# Patient Record
Sex: Male | Born: 1965 | Race: White | Hispanic: No | Marital: Single | Smoking: Never smoker
Health system: Southern US, Community
[De-identification: ages and names within clinical notes are randomized; demographics above are authoritative.]

## PROBLEM LIST (undated history)

## (undated) DIAGNOSIS — E785 Hyperlipidemia, unspecified: Secondary | ICD-10-CM

## (undated) DIAGNOSIS — H3500 Unspecified background retinopathy: Secondary | ICD-10-CM

## (undated) DIAGNOSIS — I10 Essential (primary) hypertension: Secondary | ICD-10-CM

## (undated) DIAGNOSIS — N529 Male erectile dysfunction, unspecified: Secondary | ICD-10-CM

## (undated) DIAGNOSIS — F419 Anxiety disorder, unspecified: Secondary | ICD-10-CM

## (undated) DIAGNOSIS — D124 Benign neoplasm of descending colon: Secondary | ICD-10-CM

## (undated) DIAGNOSIS — I639 Cerebral infarction, unspecified: Secondary | ICD-10-CM

## (undated) DIAGNOSIS — Z794 Long term (current) use of insulin: Secondary | ICD-10-CM

## (undated) DIAGNOSIS — E119 Type 2 diabetes mellitus without complications: Secondary | ICD-10-CM

## (undated) HISTORY — DX: Unspecified background retinopathy: H35.00

## (undated) HISTORY — DX: Cerebral infarction, unspecified: I63.9

## (undated) HISTORY — DX: Male erectile dysfunction, unspecified: N52.9

## (undated) HISTORY — DX: Long term (current) use of insulin: Z79.4

## (undated) HISTORY — DX: Type 2 diabetes mellitus without complications: E11.9

## (undated) HISTORY — PX: KNEE ARTHROSCOPY: SUR90

## (undated) HISTORY — DX: Essential (primary) hypertension: I10

## (undated) HISTORY — DX: Anxiety disorder, unspecified: F41.9

## (undated) HISTORY — DX: Hyperlipidemia, unspecified: E78.5

## (undated) HISTORY — DX: Benign neoplasm of descending colon: D12.4

---
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
## 1997-07-14 ENCOUNTER — Inpatient Hospital Stay (HOSPITAL_COMMUNITY): Admission: EM | Admit: 1997-07-14 | Discharge: 1997-07-15 | Payer: Self-pay | Admitting: Emergency Medicine

## 1997-07-20 ENCOUNTER — Emergency Department (HOSPITAL_COMMUNITY): Admission: EM | Admit: 1997-07-20 | Discharge: 1997-07-20 | Payer: Self-pay | Admitting: Emergency Medicine

## 1997-08-28 ENCOUNTER — Ambulatory Visit (HOSPITAL_COMMUNITY): Admission: RE | Admit: 1997-08-28 | Discharge: 1997-08-28 | Payer: Self-pay | Admitting: Internal Medicine

## 1997-09-11 ENCOUNTER — Encounter: Payer: Self-pay | Admitting: Neurology

## 1997-09-11 ENCOUNTER — Inpatient Hospital Stay (HOSPITAL_COMMUNITY): Admission: AD | Admit: 1997-09-11 | Discharge: 1997-09-14 | Payer: Self-pay | Admitting: Neurology

## 1997-09-12 ENCOUNTER — Encounter: Payer: Self-pay | Admitting: Neurology

## 2001-01-19 ENCOUNTER — Ambulatory Visit (HOSPITAL_COMMUNITY): Admission: RE | Admit: 2001-01-19 | Discharge: 2001-01-19 | Payer: Self-pay | Admitting: Family Medicine

## 2001-01-19 ENCOUNTER — Encounter: Payer: Self-pay | Admitting: Family Medicine

## 2002-01-14 ENCOUNTER — Inpatient Hospital Stay (HOSPITAL_COMMUNITY): Admission: EM | Admit: 2002-01-14 | Discharge: 2002-01-15 | Payer: Self-pay | Admitting: Emergency Medicine

## 2002-08-14 ENCOUNTER — Ambulatory Visit (HOSPITAL_COMMUNITY): Admission: RE | Admit: 2002-08-14 | Discharge: 2002-08-14 | Payer: Self-pay | Admitting: Family Medicine

## 2002-08-14 ENCOUNTER — Encounter: Payer: Self-pay | Admitting: Family Medicine

## 2002-08-28 ENCOUNTER — Encounter: Payer: Self-pay | Admitting: Family Medicine

## 2002-08-28 ENCOUNTER — Ambulatory Visit (HOSPITAL_COMMUNITY): Admission: RE | Admit: 2002-08-28 | Discharge: 2002-08-28 | Payer: Self-pay | Admitting: Family Medicine

## 2002-08-29 ENCOUNTER — Encounter: Admission: RE | Admit: 2002-08-29 | Discharge: 2002-11-27 | Payer: Self-pay | Admitting: Family Medicine

## 2002-12-23 ENCOUNTER — Ambulatory Visit (HOSPITAL_COMMUNITY): Admission: RE | Admit: 2002-12-23 | Discharge: 2002-12-23 | Payer: Self-pay | Admitting: Infectious Diseases

## 2003-12-09 ENCOUNTER — Emergency Department (HOSPITAL_COMMUNITY): Admission: EM | Admit: 2003-12-09 | Discharge: 2003-12-09 | Payer: Self-pay | Admitting: Emergency Medicine

## 2004-12-12 ENCOUNTER — Inpatient Hospital Stay (HOSPITAL_COMMUNITY): Admission: EM | Admit: 2004-12-12 | Discharge: 2004-12-14 | Payer: Self-pay | Admitting: *Deleted

## 2005-03-09 ENCOUNTER — Ambulatory Visit: Payer: Self-pay | Admitting: Family Medicine

## 2005-06-07 ENCOUNTER — Ambulatory Visit: Payer: Self-pay | Admitting: Family Medicine

## 2005-10-25 ENCOUNTER — Ambulatory Visit: Payer: Self-pay | Admitting: Family Medicine

## 2008-12-03 ENCOUNTER — Encounter: Payer: Self-pay | Admitting: Family Medicine

## 2009-10-13 ENCOUNTER — Ambulatory Visit: Payer: Self-pay | Admitting: Family Medicine

## 2009-10-13 DIAGNOSIS — E1065 Type 1 diabetes mellitus with hyperglycemia: Secondary | ICD-10-CM | POA: Insufficient documentation

## 2009-10-14 ENCOUNTER — Telehealth: Payer: Self-pay | Admitting: Family Medicine

## 2009-10-14 DIAGNOSIS — M899 Disorder of bone, unspecified: Secondary | ICD-10-CM | POA: Insufficient documentation

## 2009-10-14 DIAGNOSIS — M949 Disorder of cartilage, unspecified: Secondary | ICD-10-CM

## 2009-10-14 HISTORY — DX: Disorder of bone, unspecified: M89.9

## 2009-10-15 ENCOUNTER — Encounter: Payer: Self-pay | Admitting: Family Medicine

## 2009-10-15 LAB — CONVERTED CEMR LAB
AST: 24 units/L (ref 0–37)
Basophils Relative: 0 % (ref 0–1)
Eosinophils Absolute: 0.1 10*3/uL (ref 0.0–0.7)
Eosinophils Relative: 1 % (ref 0–5)
Helicobacter Pylori Antibody-IgG: <0.4
Lymphocytes Relative: 30 % (ref 12–46)
MCHC: 32.8 g/dL (ref 30.0–36.0)
Monocytes Absolute: 0.5 10*3/uL (ref 0.1–1.0)
Neutrophils Relative %: 61 % (ref 43–77)
Total Bilirubin: 0.9 mg/dL (ref 0.3–1.2)
WBC: 6.9 10*3/uL (ref 4.0–10.5)

## 2009-10-16 ENCOUNTER — Ambulatory Visit (HOSPITAL_COMMUNITY): Admission: RE | Admit: 2009-10-16 | Discharge: 2009-10-16 | Payer: Self-pay | Admitting: Family Medicine

## 2009-10-16 ENCOUNTER — Encounter: Payer: Self-pay | Admitting: Family Medicine

## 2009-10-16 LAB — CONVERTED CEMR LAB
BUN: 19 mg/dL (ref 6–23)
BUN: 7 mg/dL (ref 6–23)
CO2: 23 meq/L (ref 19–32)
CO2: 27 meq/L (ref 19–32)
Calcium: 10.3 mg/dL (ref 8.4–10.5)
Chloride: 94 meq/L — ABNORMAL LOW (ref 96–112)
Chloride: 103 meq/L (ref 96–112)
Creatinine, Ser: 0.91 mg/dL (ref 0.40–1.50)
PSA: 0.52 ng/mL (ref 0.10–4.00)
Potassium: >7.5 meq/L (ref 3.5–5.3)
TSH: 2.419 microintl units/mL (ref 0.350–4.500)

## 2009-10-19 ENCOUNTER — Telehealth: Payer: Self-pay | Admitting: Family Medicine

## 2009-10-19 ENCOUNTER — Encounter: Payer: Self-pay | Admitting: Family Medicine

## 2009-11-12 ENCOUNTER — Telehealth (INDEPENDENT_AMBULATORY_CARE_PROVIDER_SITE_OTHER): Payer: Self-pay | Admitting: *Deleted

## 2009-11-23 DIAGNOSIS — R110 Nausea: Secondary | ICD-10-CM | POA: Insufficient documentation

## 2009-11-24 ENCOUNTER — Encounter: Payer: Self-pay | Admitting: Family Medicine

## 2009-11-24 ENCOUNTER — Telehealth: Payer: Self-pay | Admitting: Family Medicine

## 2009-12-03 ENCOUNTER — Encounter (HOSPITAL_COMMUNITY): Admission: RE | Admit: 2009-12-03 | Payer: Self-pay | Admitting: Family Medicine

## 2009-12-08 ENCOUNTER — Ambulatory Visit: Payer: Self-pay | Admitting: Family Medicine

## 2009-12-08 LAB — HM DIABETES FOOT EXAM

## 2009-12-11 ENCOUNTER — Encounter: Payer: Self-pay | Admitting: Family Medicine

## 2010-01-03 HISTORY — PX: EYE SURGERY: SHX253

## 2010-01-24 ENCOUNTER — Encounter: Payer: Self-pay | Admitting: Family Medicine

## 2010-02-02 ENCOUNTER — Ambulatory Visit
Admission: RE | Admit: 2010-02-02 | Discharge: 2010-02-02 | Payer: Self-pay | Source: Home / Self Care | Attending: Family Medicine | Admitting: Family Medicine

## 2010-02-02 DIAGNOSIS — N529 Male erectile dysfunction, unspecified: Secondary | ICD-10-CM | POA: Insufficient documentation

## 2010-02-02 DIAGNOSIS — H547 Unspecified visual loss: Secondary | ICD-10-CM | POA: Insufficient documentation

## 2010-02-02 HISTORY — DX: Male erectile dysfunction, unspecified: N52.9

## 2010-02-04 ENCOUNTER — Encounter: Payer: Self-pay | Admitting: Family Medicine

## 2010-02-04 LAB — CONVERTED CEMR LAB: Microalb, Ur: 1.26 mg/dL (ref 0.00–1.89)

## 2010-02-04 NOTE — Progress Notes (Signed)
SummaryRushie Buchanan  Phone Note Call from Patient   Summary of Call: [name] AT [facility] Alanis AT [phone] ABOUT A RX Initial call taken by: Lind Guest,  October 19, 2009 1:04 PM  Follow-up for Phone Call        Phone Call Completed Follow-up by: Adella Hare LPN,  October 19, 2009 4:53 PM

## 2010-02-04 NOTE — Assessment & Plan Note (Signed)
Summary: office visit   Vital Signs:  Patient profile:   45 year old male Height:      71 inches Weight:      198 pounds BMI:     27.72 O2 Sat:      97 % on Room air Pulse rate:   87 / minute Pulse rhythm:   regular Resp:     16 per minute BP sitting:   120 / 60  (left arm)  Vitals Entered By: Adella Hare LPN (December 08, 2009 1:58 PM)  Nutrition Counseling: Patient's BMI is greater than 25 and therefore counseled on weight management options.  O2 Flow:  Room air CC: follow-up visit Is Patient Diabetic? Yes Did you bring your meter with you today? No Pain Assessment Patient in pain? no        CC:  follow-up visit.  History of Present Illness: Reports  that he is feeling much better, and isreassure by negative battery of tests recently done. Wants to hold off on HIDA AT THIS TIME. Denies recent fever or chills. Denies sinus pressure, nasal congestion , ear pain or sore throat. Denies chest congestion, or cough productive of sputum. Denies chest pain, palpitations, PND, orthopnea or leg swelling. Denies abdominal pain,, vomitting, diarrhea or constipation. Denies change in bowel movements or bloody stool. Denies dysuria , frequency, incontinence or hesitancy. Denies  joint pain, swelling, or reduced mobility. Denies headaches, vertigo, seizures. Denies depression, anxiety or insomnia. Denies  rash, lesions, or itch. BLOOD SUGARS STILL UNCONTROLLED, interested in nutrition referral, and will start testing with respect to meals and documenting     Current Medications (verified): 1)  Novolog Flexpen 100 Unit/ml Soln (Insulin Aspart) .... Per Sliding Scale 2)  Lantus Solostar 100 Unit/ml Soln (Insulin Glargine) .... 25 Units Twice Daily 3)  Zofran Orally Disineigrating Tablet 4mg  Tablet .... One Tablet Twice Daily As Needed For Severe Nausea  Allergies (verified): No Known Drug Allergies  Review of Systems      See HPI General:  Complains of fatigue. Eyes:   Denies discharge and red eye. GI:  Complains of nausea. Endo:  3 times daily 130 to 170, 2 hrs after the lunch avg170's to 190's, before supper rangeis 140's to 170'6. Heme:  Denies abnormal bruising, bleeding, enlarge lymph nodes, and fevers. Allergy:  Denies hives or rash and itching eyes.  Physical Exam  General:  Well-developed,well-nourished,in no acute distress; alert,appropriate and cooperative throughout examination HEENT: No facial asymmetry,  EOMI, No sinus tenderness, TM's Clear, oropharynx  pink and moist.   Chest: Clear to auscultation bilaterally.  CVS: S1, S2, No murmurs, No S3.   Abd: Soft, Nontender.  MS: Adequate ROM spine, hips, shoulders and knees.  Ext: No edema.   CNS: CN 2-12 intact, power tone and sensation normal throughout.   Skin: Intact, no visible lesions or rashes.  Psych: Good eye contact, normal affect.  Memory intact, not anxious or depressed appearing.   Diabetes Management Exam:    Foot Exam (with socks and/or shoes not present):       Sensory-Monofilament:          Left foot: diminished          Right foot: diminished       Inspection:          Left foot: normal          Right foot: normal       Nails:          Left  foot: thickened          Right foot: thickened   Impression & Recommendations:  Problem # 1:  NAUSEA (ICD-787.02) Assessment Improved  Problem # 2:  DIABETES MELLITUS, TYPE I, UNCONTROLLED (ICD-250.03) Assessment: Comment Only Patient advised to reduce carbs and sweets, commit to regular physical activity, take meds as prescribed, test blood sugars as directed, and attempt to lose weight , to improve blood sugar control.  His updated medication list for this problem includes:    Novolog Flexpen 100 Unit/ml Soln (Insulin aspart) .Marland Kitchen... Per sliding scale    Lantus Solostar 100 Unit/ml Soln (Insulin glargine) .Marland Kitchen... 25 units twice daily  Orders: T- Hemoglobin A1C (16109-60454)  Labs Reviewed: Creat: 0.77 (10/16/2009)      Reviewed HgBA1c results: 8.5 (10/13/2009)  Complete Medication List: 1)  Novolog Flexpen 100 Unit/ml Soln (Insulin aspart) .... Per sliding scale 2)  Lantus Solostar 100 Unit/ml Soln (Insulin glargine) .... 25 units twice daily 3)  Zofran Orally Disineigrating Tablet 4mg  Tablet  .... One tablet twice daily as needed for severe nausea  Other Orders: Pneumococcal Vaccine (09811) Admin 1st Vaccine (91478) Tdap => 37yrs IM (29562) Admin of Any Addtl Vaccine (13086)  Patient Instructions: 1)  F/U end January. 2)  HBA1c  end January. 3)  You will be referred to diabetic ed. 4)  PLS start recording your tests 3 times daily. 5)  Pneumonia and TDAP today   Orders Added: 1)  Est. Patient Level IV [57846] 2)  T- Hemoglobin A1C [83036-23375] 3)  Pneumococcal Vaccine [90732] 4)  Admin 1st Vaccine [90471] 5)  Tdap => 40yrs IM [90715] 6)  Admin of Any Addtl Vaccine [96295]   Immunizations Administered:  Pneumonia Vaccine:    Vaccine Type: Pneumovax    Site: right deltoid    Mfr: Merck    Dose: 0.5 ml    Route: IM    Given by: Adella Hare LPN    Exp. Date: 03/22/2011    Lot #: [id number]    VIS given: 12/08/08 version given December 08, 2009.  Tetanus Vaccine:    Vaccine Type: Tdap    Site: left deltoid    Mfr: GlaxoSmithKline    Dose: 0.5 ml    Route: IM    Given by: Adella Hare LPN    Exp. Date: 10/23/2011    Lot #: [id number]    VIS given: 11/21/07 version given December 08, 2009.   Immunizations Administered:  Pneumonia Vaccine:    Vaccine Type: Pneumovax    Site: right deltoid    Mfr: Merck    Dose: 0.5 ml    Route: IM    Given by: Adella Hare LPN    Exp. Date: 03/22/2011    Lot #: [id number]    VIS given: 12/08/08 version given December 08, 2009.  Tetanus Vaccine:    Vaccine Type: Tdap    Site: left deltoid    Mfr: GlaxoSmithKline    Dose: 0.5 ml    Route: IM    Given by: Adella Hare LPN    Exp. Date: 10/23/2011    Lot #: [id number]    VIS given: 11/21/07  version given December 08, 2009.

## 2010-02-04 NOTE — Progress Notes (Signed)
Summary: results  Phone Note Call from Patient   Summary of Call: pt would like results. [phone] Initial call taken by: Rudene Anda,  October 14, 2009 11:11 AM  Follow-up for Phone Call        see append Follow-up by: Syliva Overman MD,  October 15, 2009 5:15 AM  Additional Follow-up for Phone Call Additional follow up Details #1::        Patient aware Additional Follow-up by: Everitt Amber LPN,  October 15, 2009 12:27 PM  New Problems: SPECIAL SCREENING MALIGNANT NEOPLASM OF PROSTATE (ICD-V76.44) FATIGUE (ICD-780.79) DISORDER OF BONE AND CARTILAGE UNSPECIFIED (ICD-733.90)   New Problems: SPECIAL SCREENING MALIGNANT NEOPLASM OF PROSTATE (ICD-V76.44) FATIGUE (ICD-780.79) DISORDER OF BONE AND CARTILAGE UNSPECIFIED (ICD-733.90)

## 2010-02-04 NOTE — Progress Notes (Signed)
Summary: medicine  Phone Note Call from Patient   Summary of Call: need the decsengrading  zofram  please send to [facility]  call back at   [phone] Initial call taken by: Lind Guest,  November 24, 2009 2:22 PM  Follow-up for Phone Call        completed Follow-up by: Syliva Overman MD,  November 24, 2009 2:53 PM

## 2010-02-04 NOTE — Medication Information (Signed)
Summary: Tax adviser   Imported By: Lind Guest 11/24/2009 17:29:13  _____________________________________________________________________  External Attachment:    Type:   Image     Comment:   External Document

## 2010-02-04 NOTE — Letter (Signed)
Summary: MEDICAL RELEASE  MEDICAL RELEASE   Imported By: Lind Guest 10/19/2009 13:01:31  _____________________________________________________________________  External Attachment:    Type:   Image     Comment:   External Document

## 2010-02-04 NOTE — Progress Notes (Signed)
Summary: MEDICINE  Phone Note Call from Patient   Summary of Call: [facility] APPT FOR 12.6.11 NEEDS ZOFRAM NOT TABLET NEEDS THE KIND YOU PUT UNDER YOUR TONGUE SEND TO [facility] [place] CALL BACK AT [phone] Initial call taken by: Lind Guest,  November 12, 2009 1:38 PM  Follow-up for Phone Call        pls let pt know , since he has so much nausea, and his gall bladder US shows no stones , he needs to have a hIDA scan to see the function , if he agrees pls sched, Follow-up by: Syliva Overman MD,  November 23, 2009 8:11 PM  Additional Follow-up for Phone Call Additional follow up Details #1::        prescription for nausea printwed, pls let him know and fax, thanks Additional Follow-up by: Syliva Overman MD,  November 24, 2009 2:53 PM  New Problems: NAUSEA (ICD-787.02)   Additional Follow-up for Phone Call Additional follow up Details #2::    FAXED IN PRESCRIPITON AND CALLED PATIENT TO ADVISE IT WAS SENT IN. Follow-up by: Curtis Sites,  November 24, 2009 4:36 PM  New Problems: NAUSEA (ICD-787.02) New/Updated Medications: * ZOFRAN ORALLY DISINEIGRATING TABLET 4MG  TABLET one tablet twice daily as needed for severe nausea Prescriptions: ZOFRAN ORALLY DISINEIGRATING TABLET 4MG  TABLET one tablet twice daily as needed for severe nausea  #20 x 0   Entered and Authorized by:   Syliva Overman MD   Signed by:   Syliva Overman MD on 11/24/2009   Method used:   Printed then faxed to ...       [facility] [address] [phone]* (retail)       [address], [place]  [address]       Ph: [phone]       Fax: [phone]   RxID:   [id number]

## 2010-02-04 NOTE — Assessment & Plan Note (Signed)
Summary: office visit   Vital Signs:  Patient profile:   46 year old male Height:      71 inches Weight:      196.25 pounds BMI:     27.47 O2 Sat:      97 % on Room air Pulse rate:   91 / minute Pulse rhythm:   regular Resp:     16 per minute BP sitting:   126 / 84  (left arm)  Vitals Entered By: Adella Hare LPN (October 13, 2009 11:58 AM)  Nutrition Counseling: Patient's BMI is greater than 25 and therefore counseled on weight management options.  O2 Flow:  Room air CC: nausea, vommitting, decreased appetite, weight loss, low grade fever in evening over one week Is Patient Diabetic? Yes Pain Assessment Patient in pain? no        CC:  nausea, vommitting, decreased appetite, weight loss, and low grade fever in evening over one week.  History of Present Illness: Fatigue for several months, low grade fevers to 102 for 1 month lump on neck right side.Increasing in size, painless intermittent nausea. pt re-establishing care afterover 3 yrs, stats he has not been doing well in recent times and wants to kniow what's going on. He denies depression, generally has no anxiety. He denies head or chest congestion. He denies abdominal pain, he has niotednausea, he denies diarreah or constipation. He has chronic eD, but denies frequency or poor urinary stream. pt states he was in an ED 1 day ago, where he left his job because he felt so ill, no abn was identified per his report. he has not been diligent with blood sugar testing and reports fluctuation in values when he does test. He has not had his HBA1C checked for over 4 mths.    Preventive Screening-Counseling & Management  Alcohol-Tobacco     Smoking Status: never  Caffeine-Diet-Exercise     Does Patient Exercise: no      Drug Use:  no.    Allergies (verified): No Known Drug Allergies  Past History:  Past Medical History: IDDM  sinc e age 34ge 18 ED  Past Surgical History: none  Family History: Mom age 59, DM,  HTN and hyperlipidemia and rosacea 2011 Dad  age 67 HTN and gERd and nicotine Sisters x 2 , one has IDDM  Social History: Single. Employed  as EMT Never Smoked Alcohol use-no Drug use-no Regular exercise-no Smoking Status:  never Drug Use:  no Does Patient Exercise:  no  Review of Systems      See HPI General:  Complains of chills, fatigue, fever, loss of appetite, malaise, and weakness. Eyes:  Denies discharge, eye pain, and red eye. ENT:  Denies earache, hoarseness, nasal congestion, and sinus pressure. CV:  Denies chest pain or discomfort, palpitations, shortness of breath with exertion, and swelling of feet. Resp:  Denies cough, sputum productive, and wheezing. GI:  Complains of loss of appetite and nausea; denies abdominal pain, bloody stools, constipation, diarrhea, and vomiting. GU:  Complains of erectile dysfunction; denies hematuria, incontinence, nocturia, urinary frequency, and urinary hesitancy. MS:  Denies joint pain and stiffness. Derm:  Complains of lesion(s); denies itching and rash; concerned about painless right neck swelling. Neuro:  Denies headaches, poor balance, seizures, and sensation of room spinning. Psych:  Complains of anxiety; denies depression and mental problems. Endo:  Denies excessive thirst and excessive urination. Heme:  Denies abnormal bruising and bleeding. Allergy:  Denies hives or rash and itching eyes.  Physical Exam  General:  Well-developed,well-nourished,in no acute distress; alert,appropriate and cooperative throughout examinationAnxious HEENT: No facial asymmetry,  EOMI, No sinus tenderness, TM's Clear, oropharynx  pink and moist. Right anterior cervical swelling  Chest: Clear to auscultation bilaterally.  CVS: S1, S2, No murmurs, No S3.   Abd: Soft, Nontender.  MS: Adequate ROM spine, hips, shoulders and knees.  Ext: No edema.   CNS: CN 2-12 intact, power tone and sensation normal throughout.   Skin: Intact, no visible lesions or  rashes.  Psych: Good eye contact, normal affect.  Memory intact, not  depressed appearing.   Diabetes Management Exam:    Foot Exam (with socks and/or shoes not present):       Sensory-Monofilament:          Left foot: diminished          Right foot: diminished       Inspection:          Left foot: normal          Right foot: normal       Nails:          Left foot: normal          Right foot: normal   Impression & Recommendations:  Problem # 1:  NECK MASS (ICD-784.2) Assessment Comment Only  Orders: Radiology Referral (Radiology)  Problem # 2:  DIABETES MELLITUS, TYPE I, UNCONTROLLED (ICD-250.03) Assessment: Comment Only  The following medications were removed from the medication list:    Lantus Solostar 100 Unit/ml Soln (Insulin glargine) .Marland Kitchen... 20 units in am and 20 units pm His updated medication list for this problem includes:    Novolog Flexpen 100 Unit/ml Soln (Insulin aspart) .Marland Kitchen... Per sliding scale    Lantus Solostar 100 Unit/ml Soln (Insulin glargine) .Marland Kitchen... 25 units twice daily  Orders: T- Hemoglobin A1C ([address]31-51761)  Complete Medication List: 1)  Novolog Flexpen 100 Unit/ml Soln (Insulin aspart) .... Per sliding scale 2)  Lantus Solostar 100 Unit/ml Soln (Insulin glargine) .... 25 units twice daily  Other Orders: T-Hepatic Function [phone]) T-CBC w/Diff [phone]) T-Syphilis Test (RPR) [phone]) T-HIV Antibody  (Reflex) (93716-96789) TLB-H. Pylori Abs(Helicobacter Pylori) ([address]HELICO)  Patient Instructions: 1)  F/U in 5 weeks 2)  Labs today, we will get the others from the Ed 3)  Hepatic Panel prior to visit, ICD-9: 4)  HbgA1C prior to visit, ICD-9: 5)  RPR and HIV 6)  CBC 7)  You will ber referred for gllbladder studies, andUS of your neck and probably to a surgeon Prescriptions: LANTUS SOLOSTAR 100 UNIT/ML SOLN (INSULIN GLARGINE) 25 units twice daily  #1500 units x 3   Entered and Authorized by:   Syliva Overman MD   Signed by:    Syliva Overman MD on 10/18/2009   Method used:   Printed then faxed to ...       [facility] [address] [phone]* (retail)       [address] [place]  [address]       Ph: [phone]       Fax: [phone]   RxID:   [id number]

## 2010-02-04 NOTE — Letter (Signed)
Summary: nutritional care  nutritional care   Imported By: Lind Guest 12/11/2009 15:16:04  _____________________________________________________________________  External Attachment:    Type:   Image     Comment:   External Document

## 2010-02-06 ENCOUNTER — Encounter: Payer: Self-pay | Admitting: Family Medicine

## 2010-02-06 LAB — CONVERTED CEMR LAB
ALT: 18 units/L (ref 0–53)
AST: 16 units/L (ref 0–37)
Albumin: 4.4 g/dL (ref 3.5–5.2)
Creatinine, Ser: 0.73 mg/dL (ref 0.40–1.50)
Potassium: 4.3 meq/L (ref 3.5–5.3)
Sodium: 140 meq/L (ref 135–145)

## 2010-02-10 NOTE — Assessment & Plan Note (Signed)
Summary: F UP   Vital Signs:  Patient profile:   45 year old male Height:      71 inches Weight:      204.25 pounds BMI:     28.59 O2 Sat:      98 % on Room air Pulse rate:   94 / minute Pulse rhythm:   regular Resp:     16 per minute BP sitting:   140 / 78  (left arm)  Vitals Entered By: Adella Hare LPN (February 02, 2010 11:11 AM)  Nutrition Counseling: Patient's BMI is greater than 25 and therefore counseled on weight management options.  O2 Flow:  Room air CC: follow-up visit Is Patient Diabetic? Yes Comments did not bring meds to ov but states list is accurate   CC:  follow-up visit.  History of Present Illness: Reports  that the is doing well. Denies recent fever or chills. Denies sinus pressure, nasal congestion , ear pain or sore throat. Denies chest congestion, or cough productive of sputum. Denies chest pain, palpitations, PND, orthopnea or leg swelling. Denies abdominal pain, nausea, vomitting, diarrhea or constipation. Denies change in bowel movements or bloody stool. Denies dysuria , frequency, incontinence or hesitancy. Denies  joint pain, swelling, or reduced mobility. Denies headaches, vertigo, seizures. Denies depression, anxiety or insomnia. Denies  rash, lesions, or itch. He has blood sugar diary is testing twice daily and has noted marked improvement in his sugars as well as how he feels     Allergies: No Known Drug Allergies  Review of Systems      See HPI Eyes:  Complains of vision loss-both eyes; denies discharge and eye pain. Endo:  Denies excessive hunger and excessive thirst. Heme:  Denies abnormal bruising and bleeding. Allergy:  Denies hives or rash.  Physical Exam  General:  Well-developed,well-nourished,in no acute distress; alert,appropriate and cooperative throughout examination HEENT: No facial asymmetry,  EOMI, No sinus tenderness, TM's Clear, oropharynx  pink and moist.   Chest: Clear to auscultation bilaterally.  CVS: S1,  S2, No murmurs, No S3.   Abd: Soft, Nontender.  MS: Adequate ROM spine, hips, shoulders and knees.  Ext: No edema.   CNS: CN 2-12 intact, power tone and sensation normal throughout.   Skin: Intact, no visible lesions or rashes.  Psych: Good eye contact, normal affect.  Memory intact, not anxious or depressed appearing.    Impression & Recommendations:  Problem # 1:  ERECTILE DYSFUNCTION, ORGANIC (ICD-607.84) Assessment Unchanged  His updated medication list for this problem includes:    Levitra 20 Mg Tabs (Vardenafil hcl) ..... One tablet 30 minutes before intercourse as needed  Problem # 2:  DIABETES MELLITUS, TYPE I, UNCONTROLLED (ICD-250.03) Assessment: Improved  His updated medication list for this problem includes:    Novolog Flexpen 100 Unit/ml Soln (Insulin aspart) .Marland Kitchen... Per sliding scale    Lantus Solostar 100 Unit/ml Soln (Insulin glargine) .Marland Kitchen... 25 units twice daily  Orders: T-CMP with estimated GFR ([address]) T- Hemoglobin A1C (40981-19147) T-Urine Microalbumin w/creat. ratio [id number]) T- Hemoglobin A1C [phone])  Labs Reviewed: Creat: 0.77 (10/16/2009)    Reviewed HgBA1c results: 8.5 (10/13/2009)  Complete Medication List: 1)  Novolog Flexpen 100 Unit/ml Soln (Insulin aspart) .... Per sliding scale 2)  Lantus Solostar 100 Unit/ml Soln (Insulin glargine) .... 25 units twice daily 3)  Zofran Orally Disineigrating Tablet 4mg  Tablet  .... One tablet twice daily as needed for severe nausea 4)  Levitra 20 Mg Tabs (Vardenafil hcl) .... One tablet 30 minutes before  intercourse as needed 5)  Onetouch Ultra Blue Strp (Glucose blood) .... Three times a day testing dx:250.01 6)  Onetouch Delica Lancets Misc (Lancets) .... Three times a day testing dx:250.01  Other Orders: T-Lipid Profile (04540-98119) Ophthalmology Referral (Ophthalmology)  Patient Instructions: 1)  Please schedule a follow-up appointment in 3 months. 2)  It is important that you  exercise regularly at least 30 minutes 5 times a week. If you develop chest pain, have severe difficulty breathing, or feel very tired , stop exercising immediately and seek medical attention. 3)  You need to lose weight. Consider a lower calorie diet and regular exercise.  4)  BMP prior to visit, ICD-9: and egfr 5)  HbgA1C prior to visit, ICD-9:  today 6)  Urine Microalbumin prior to visit, ICD-9: 7)  Fasting lipid hBA1C in 3 months 8)  pls continue to test and record blood sugars, they will only improve Prescriptions: ONETOUCH DELICA LANCETS  MISC (LANCETS) three times a day testing dx:250.01  #100 x 3   Entered by:   Adella Hare LPN   Authorized by:   Syliva Overman MD   Signed by:   Adella Hare LPN on 14/78/2956   Method used:   Electronically to        [facility] [address] [phone]* (retail)       [address] [place], [place]  [address]       Ph: [phone]       Fax: [phone]   RxID:   [id number] [place] BLUE  STRP (GLUCOSE BLOOD) three times a day testing dx:250.01  #100 x 4   Entered by:   Adella Hare LPN   Authorized by:   Syliva Overman MD   Signed by:   Adella Hare LPN on 38/75/6433   Method used:   Electronically to        [facility] [phone]* (retail)       [address] [place], [place]  [address]       Ph: [phone]       Fax: [phone]   RxID:   [id number] LEVITRA 20 MG TABS (VARDENAFIL HCL) one tablet 30 minutes before intercourse as needed  #8 x 3   Entered and Authorized by:   Syliva Overman MD   Signed by:   Syliva Overman MD on 02/02/2010   Method used:   Electronically to        [facility] [address] [phone]* (retail)       [address] [place], [place]  [address]       Ph: [phone]       Fax: [phone]   RxID:   [id number]    Orders Added: 1)  Est. Patient Level IV [10175] 2)  T-CMP with estimated GFR [[address]] 3)  T- Hemoglobin A1C [83036-23375] 4)  T-Urine Microalbumin  w/creat. ratio [82043-[address]] 5)  T-Lipid Profile [80061-22930] 6)  T- Hemoglobin A1C [83036-23375] 7)  Ophthalmology Referral [Ophthalmology]

## 2010-02-10 NOTE — Miscellaneous (Signed)
  Clinical Lists Changes  Medications: Removed medication of LANTUS SOLOSTAR 100 UNIT/ML SOLN (INSULIN GLARGINE) 25 units twice daily Added new medication of LANTUS SOLOSTAR 100 UNIT/ML SOLN (INSULIN GLARGINE) 30 units twice daily, dose increase effective 02/06/2010 - Signed Rx of LANTUS SOLOSTAR 100 UNIT/ML SOLN (INSULIN GLARGINE) 30 units twice daily, dose increase effective 02/06/2010;  #1800 units x 4;  Signed;  Entered by: Syliva Overman MD;  Authorized by: Syliva Overman MD;  Method used: Historical    Prescriptions: LANTUS SOLOSTAR 100 UNIT/ML SOLN (INSULIN GLARGINE) 30 units twice daily, dose increase effective 02/06/2010  #1800 units x 4   Entered and Authorized by:   Syliva Overman MD   Signed by:   Syliva Overman MD on 02/06/2010   Method used:   Historical   RxID:   [id number]

## 2010-03-28 ENCOUNTER — Other Ambulatory Visit: Payer: Self-pay | Admitting: Family Medicine

## 2010-04-27 ENCOUNTER — Encounter: Payer: Self-pay | Admitting: Family Medicine

## 2010-04-29 ENCOUNTER — Encounter: Payer: Self-pay | Admitting: Family Medicine

## 2010-05-03 ENCOUNTER — Ambulatory Visit: Payer: Self-pay | Admitting: Family Medicine

## 2010-05-21 NOTE — H&P (Signed)
NAME:  Sean Buchanan, Sean Buchanan                   ACCOUNT NO.:  [id number]   MEDICAL RECORD NO.:  [id number]          PATIENT TYPE:  INP   LOCATION:  A217                          FACILITY:  [facility]   PHYSICIAN:  Margaretmary Dys, M.D.DATE OF BIRTH:  19-Oct-1965   DATE OF ADMISSION:  12/12/2004  DATE OF DISCHARGE:  LH                                HISTORY & PHYSICAL   PRIMARY CARE PHYSICIAN:  Milus Mallick. Lodema Hong, M.D.   ADMITTING DIAGNOSES:  1.  Diabetic ketoacidosis.  2.  Severe dehydration.  3.  Nausea and vomiting.   CHIEF COMPLAINT:  Nausea, vomiting, and weakness of two days' duration.   Sean Buchanan is a 45 year old patient with a 20-year history of type 1 diabetes  who presented to the emergency room with complaints of nausea, vomiting, and  generalized body weakness of two days' duration.  The patient said he was  fine on Friday night an actually went out to a party and has some mushrooms  and other meals.  He did fine with no abdominal cramping.  However, on  Saturday morning, he felt fairly sick with nausea and vomiting.  He denies  any significant abdominal pain, no diarrhea or constipation.  He has no  frequency, urgency, or dysuria, no hematuria.  He has no cough, no shortness  of breath, or any flulike symptoms.  He has had no headache, dizziness, or  lightheadedness.  The patient tried to drink some fluids yesterday, but  today the vomiting became intractable, and his blood sugars became elevated,  and he decided to come into the emergency room.  Evaluation her revealed  that he was in diabetic ketoacidosis with blood sugars close to 400, and his  pH was 7.2 with positive acetone in his blood.  The patient has been  admitted now for further evaluation and management.  He has received one  liter of saline in the emergency room and was started on an insulin infusion  after 10 units of Regular Insulin bolus.  The patient feels slightly better.   REVIEW OF SYSTEMS:  Temporary review of  systems as mentioned in history of  present illness above.   PAST MEDICAL HISTORY:  Type 1 diabetes of 20 years' duration.  The patient  states he has no complications including retinopathy or neuropathy.   MEDICATIONS:  1.  Lantus 15 units subcutaneously b.i.d.  2.  Sliding scale insulin,  FSBG q.a.c., q.h.s.   ALLERGIES:  He has no known drug allergies.   FAMILY HISTORY:  Positive for diabetes in mother and also a sister.  History  of coronary artery disease with MI.  An aunt died of a massive MI.  Also a  positive family history of ovarian and breast cancer and brain tumors.   SOCIAL HISTORY:  The patient is single.  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   He is a lifelong nonsmoker, does not drink alcohol or use IV drug.   PHYSICAL EXAMINATION:  GENERAL:  The patient was conscious, lethargic, and  appeared quite weak.  He was not in respiratory  distress.  VITAL SIGNS:  Blood pressure on arrival 150/89, pulse 114, temperature 97  degrees Fahrenheit, respiratory rate 20, oxygen saturation 98% on room air.  Pain scale was 0/10.  HEENT EXAM:  Normocephalic, atraumatic.  Oral mucosa was very dry.  No  exudates were noted.  NECK:  Supple, no JVD, no lymphadenopathy.  LUNGS:  Clear clinically with good air entry bilaterally.  HEART:  S1 and  S2 regular, no S3, S4, gallops, or rubs.  ABDOMEN:  Soft, nontender.  Bowel sounds were positive.  No masses palpable.  EXTREMITIES:  No pitting pedal edema.  No calf induration or tenderness was  noted.  CNS EXAMINATION:  The patient was conscious, lethargic.  No focal neurologic  deficit was noted.   LABORATORY DATA:  Blood gas on room air showed a pH of 7.256, PCO2 24.6, PO2  112, bicarbonate 10.6, with a base deficit of 15.2, oxygen saturation 97.8%.   WBC was 16.6, hemoglobin 16.8, hematocrit 49.8, platelet count 346,000,  neutrophils 79%.  Sodium 134, potassium 4.3, chloride 102, CO2 14, glucose  396, BUN 18, creatinine 1.4.   Total bilirubin was 1.9, alkaline phosphatase  158, AST 27, ALT 17, total protein 7.7, calcium 9.1.  Blood acetone was  positive.  Lipase was 12.  Urinalysis was positive for glucose, blood,  protein, and acetones.   Urine microscopy was negative.   ASSESSMENT AND PLAN:  Sean Buchanan is a 45 year old Caucasian male with a 20-  year history of type 1 diabetes who presents to the emergency room with  nausea, vomiting, and weakness.  Evaluation revealed that he is in diabetic  ketoacidosis.  It is possible that the patient may have had a food  poisoning, possible viral gastritis.  The patient says his blood sugars have  been fairly well controlled in the lower 200s and 100s range.  The plan is  to admit him at this time to the intensive care unit.  We will hydrate him  with saline and continue on the insulin infusion with a sliding scale.  We  will keep n.p.o. for now and will be able to advance his diet as tolerated.  DVT prophylaxis will be Lovenox.  GI prophylaxis will be with Protonix.   I have discussed the above plan with the patient, and he verbalized full  understanding.      Margaretmary Dys, M.D.  Electronically Signed     AM/MEDQ  D:  12/12/2004  T:  12/12/2004  Job:  [id number]   cc:   Milus Mallick. Lodema Hong, M.D.  Fax: [phone]

## 2010-05-21 NOTE — Discharge Summary (Signed)
NAME:  Sean Buchanan, Sean Buchanan                   ACCOUNT NO.:  [id number]   MEDICAL RECORD NO.:  [id number]          PATIENT TYPE:  INP   LOCATION:  A217                          FACILITY:  [facility]   PHYSICIAN:  Osvaldo Shipper, MD     DATE OF BIRTH:  08/24/1965   DATE OF ADMISSION:  12/12/2004  DATE OF DISCHARGE:  12/12/2006LH                                 DISCHARGE SUMMARY   DISCHARGE DIAGNOSES:  1.  Diabetic ketoacidosis, resolved.  2.  Type 1 diabetes.  3.  Mild proteinuria.   HISTORY OF PRESENT ILLNESS:  Please see H&P dictated at time of admission  for details regarding the patient's presenting illness.   BRIEF HOSPITAL COURSE:  Briefly, this is a 45 year old white male with a 20-  year history of type 1 diabetes who presented to the emergency room with  nausea, vomiting and generalized body weakness.  He was found to be in  diabetic ketoacidosis.  The patient was admitted to the hospital and managed  for DKA.  The patient has done well.  He has been able to tolerate p.o. with  no difficulties and does not have any nausea or vomiting.  The patient  mentioned that he has been compliant with his medications.  It is possible  that the precipitant factor was a viral syndrome.  We have adjusted his  Lantus insulin while he has been in this hospital.  His A1c level was  checked, which was 10.1, implying poor control in the past few months.  He  did mention that he has not been seeing a physician for the past many  months.  He said his last A1c was checked almost 2 years ago and was about  7.0.   The patient also mentioned that he has moved to [facility] and currently he  was visiting his mother when all of these symptoms happened.  He will pursue  a primary medical doctor at [facility].   DISCHARGE MEDICATIONS:  1.  Lantus insulin 16 units subcu twice daily.  2.  NovoLog on a sliding scale as before.   OTHER INSTRUCTIONS:  The patient has been told that he has mild proteinuria  and would benefit  from an ACE inhibitor; however, he needs to pursue this  with a primary medical doctor.  I did not want to start him on an ACE  inhibitor without knowing that the patient will follow up with a physician.   DIET:  An 1800-calorie ADA diet.   PHYSICAL ACTIVITY:  No restrictions.   CONSULTATIONS:  No consultations obtained.   IMAGING STUDIES:  None.   COMMENT:  Please note that the above is preliminary until signed.      Osvaldo Shipper, MD  Electronically Signed     GK/MEDQ  D:  12/14/2004  T:  12/14/2004  Job:  [id number]   cc:   Milus Mallick. Lodema Hong, M.D.  Fax: [phone]

## 2010-05-21 NOTE — Discharge Summary (Signed)
NAME:  Yeats, Bandy                               ACCOUNT NO.:  [id number]   MEDICAL RECORD NO.:  [id number]                   PATIENT TYPE:  INP   LOCATION:  0357                                 FACILITY:  [facility]   PHYSICIAN:  Sherin Quarry, MD                   DATE OF BIRTH:  [birth year]   DATE OF ADMISSION:  01/14/2002  DATE OF DISCHARGE:  01/15/2002                                 DISCHARGE SUMMARY   HISTORY OF PRESENT ILLNESS:  The patient is a 45 year old gentleman with  type 1 diabetes who presented on January 12th p.m. with a 12-hour history of  nausea and vomiting associated with malaise.  He had previously been managed  by a nurse practitioner in [place] who had been advising him about  management of his insulin pump.  He had last been seen at that facility  about two months previously according to the patient.  His diabetes  management generally consists of the use of an insulin pump at 1.6 units per  hour with boluses before meals which he generally calculates on the basis of  the number of grams of carbohydrates he will consume.  It would appear from  his description that his diabetes regulation is not optimal.  He often will  record blood sugars greater than 200.   PHYSICAL EXAMINATION:  VITAL SIGNS:  On presentation to the emergency room  at [facility], he was seen by Dr. Soyla Dryer.  His blood pressure was  129/89, pulse was 116, respirations 24, O2 saturation was 98%.  GENERAL:  The patient was alert and oriented.  HEENT:  Within normal limits.  CHEST:  Clear.  CARDIOVASCULAR:  A sinus tachycardia.  ABDOMEN:  Benign.  There were normal bowel sounds without masses,  tenderness, or organomegaly.  NEUROLOGIC AND EXTREMITIES:  Testing was normal.   LABORATORY DATA:  The initial laboratory studies were remarkable for a  sodium of 139, potassium 4.7, chloride 110, CO2 16, creatinine 1.1, BUN 26,  glucose 309.  The patient had an arterial blood gas which showed an  O2 of  95, PCO2 of 25, pH of 7.25.  The patient was felt to have evidence of mild  to moderate diabetic ketoacidosis.   HOSPITAL COURSE:  He was given a vigorous IV hydration with normal saline.  One liter was run in then he was given 500 cc per hour.  He was placed on  insulin effusion of 3 units per hour.  He proved to be relatively sensitive  to insulin infusion and therefore the Glucommander was never actually begun  although the Glucommander protocol was requested.  By January 13th a.m. his  CO2 was up to 24, renal function was normal, potassium was 3.9, blood sugar  was 210.  The patient was no longer experiencing nausea and vomiting and had  a good appetite.  Therefore at  that point, I chose to advance his diet to a  2000 calorie ADA diet, discontinue IV insulin and start him to resume his  basal insulin infusion.  I suggested he increase this from 1.6 to 2 units  per hour.  He will continue his current management in regard to insulin  boluses.  I advised him on January 13th that he could be discharged but told  him that I felt it was essential that he have  assistance in his diabetes  management from an endocrine specialist and suggested that he see Dr. Talmage Nap  at the [facility].  He expressed interest in doing so.  An  appointment was made with her on January 21st at 10:15 a.m.   DISCHARGE DIAGNOSES:  1. Diabetic ketoacidosis.  2.     Vomiting and dehydration.  3. Possible gastroenteritis.   CONDITION ON DISCHARGE:  Good.                                               Sherin Quarry, MD    SY/MEDQ  D:  01/15/2002  T:  01/15/2002  Job:  [id number]   cc:   Dr. Phineas Real in [place], M.D.  [phone] [address] [place] [address]  Fax: [phone]

## 2010-07-30 ENCOUNTER — Other Ambulatory Visit (INDEPENDENT_AMBULATORY_CARE_PROVIDER_SITE_OTHER): Payer: PRIVATE HEALTH INSURANCE | Admitting: Ophthalmology

## 2010-07-30 DIAGNOSIS — H4310 Vitreous hemorrhage, unspecified eye: Secondary | ICD-10-CM

## 2010-07-30 DIAGNOSIS — E11359 Type 2 diabetes mellitus with proliferative diabetic retinopathy without macular edema: Secondary | ICD-10-CM

## 2010-07-30 DIAGNOSIS — H43819 Vitreous degeneration, unspecified eye: Secondary | ICD-10-CM

## 2010-08-03 ENCOUNTER — Ambulatory Visit (INDEPENDENT_AMBULATORY_CARE_PROVIDER_SITE_OTHER): Payer: PRIVATE HEALTH INSURANCE | Admitting: Family Medicine

## 2010-08-03 ENCOUNTER — Encounter: Payer: Self-pay | Admitting: Family Medicine

## 2010-08-03 VITALS — BP 120/82 | HR 83 | Resp 16 | Ht 73.0 in | Wt 206.0 lb

## 2010-08-03 DIAGNOSIS — E1065 Type 1 diabetes mellitus with hyperglycemia: Secondary | ICD-10-CM

## 2010-08-03 DIAGNOSIS — N529 Male erectile dysfunction, unspecified: Secondary | ICD-10-CM

## 2010-08-03 DIAGNOSIS — Z125 Encounter for screening for malignant neoplasm of prostate: Secondary | ICD-10-CM

## 2010-08-03 DIAGNOSIS — R5383 Other fatigue: Secondary | ICD-10-CM

## 2010-08-03 DIAGNOSIS — R110 Nausea: Secondary | ICD-10-CM

## 2010-08-03 DIAGNOSIS — R5381 Other malaise: Secondary | ICD-10-CM

## 2010-08-03 DIAGNOSIS — Z13220 Encounter for screening for lipoid disorders: Secondary | ICD-10-CM

## 2010-08-03 DIAGNOSIS — H547 Unspecified visual loss: Secondary | ICD-10-CM

## 2010-08-03 MED ORDER — ONDANSETRON 4 MG PO TBDP: 4.0000 mg | ORAL_TABLET | Freq: Two times a day (BID) | ORAL | Status: DC | PRN

## 2010-08-03 NOTE — Patient Instructions (Signed)
CPE in mid December.  You will be referred to endocrinologist for diabetes management, this is to your advantage.   HBA1C, cmp and EGFR  Copy to Dr Fransico Him  Fasting lipip,pSA,, TSH and CBC in fasting in December , just before next OV

## 2010-08-03 NOTE — Progress Notes (Signed)
  Subjective:    Patient ID: Sean Buchanan, male    DOB: 1965-06-07, 45 y.o.   MRN: [id number]  HPI testsabout  5 times daily mornings  Range from 102 to 150. Feels fairly well. Denies polyuria, polydypsia, blurred vision or hypoglycemic episodes. Still has significant Ed   Review of Systems Denies recent fever or chills. Denies sinus pressure, nasal congestion, ear pain or sore throat. Denies chest congestion, productive cough or wheezing. Denies chest pains, palpitations and leg swelling Denies abdominal pain, , vomiting,diarrhea or constipation.  C/o chronic nausea Denies dysuria, frequency, hesitancy or incontinence. Denies joint pain, swelling and limitation in mobility. Denies headaches, seizures, numbness, or tingling. Denies depression, uncontrolled anxiety or insomnia. Denies skin break down or rash.        Objective:   Physical Exam Patient alert and oriented and in no cardiopulmonary distress.  HEENT: No facial asymmetry, EOMI, no sinus tenderness,  oropharynx pink and moist.  Neck supple no adenopathy.  Chest: Clear to auscultation bilaterally.  CVS: S1, S2 no murmurs, no S3.  ABD: Soft non tender. Bowel sounds normal.  Ext: No edema  MS: Adequate ROM spine, shoulders, hips and knees.  Skin: Intact, no ulcerations or rash noted.  Psych: Good eye contact, normal affect. Memory intact not anxious or depressed appearing.  CNS: CN 2-12 intact, power, tone and sensation normal throughout.        Assessment & Plan:   No problem-specific assessment & plan notes found for this encounter.

## 2010-08-04 ENCOUNTER — Encounter: Payer: Self-pay | Admitting: Family Medicine

## 2010-08-04 LAB — COMPLETE METABOLIC PANEL WITH GFR
ALT: 26 U/L (ref 0–53)
Alkaline Phosphatase: 138 U/L — ABNORMAL HIGH (ref 39–117)
CO2: 30 mEq/L (ref 19–32)
Creat: 0.82 mg/dL (ref 0.50–1.35)
GFR, Est African American: >60 mL/min (ref >60)
GFR, Est Non African American: >60 mL/min (ref >60)
Glucose, Bld: 83 mg/dL (ref 70–99)
Sodium: 140 mEq/L (ref 135–145)
Total Bilirubin: 0.5 mg/dL (ref 0.3–1.2)
Total Protein: 7 g/dL (ref 6.0–8.3)

## 2010-08-04 LAB — HEMOGLOBIN A1C: Mean Plasma Glucose: 180 mg/dL — ABNORMAL HIGH (ref <117)

## 2010-08-09 ENCOUNTER — Telehealth: Payer: Self-pay | Admitting: Family Medicine

## 2010-08-09 NOTE — Telephone Encounter (Signed)
pls advise blood sugar is unchanged at 7.9, liver and kidney test results are within normal

## 2010-08-11 NOTE — Telephone Encounter (Signed)
Patient aware.

## 2010-08-24 ENCOUNTER — Encounter: Payer: Self-pay | Admitting: Family Medicine

## 2010-08-24 NOTE — Assessment & Plan Note (Signed)
Unchanged, med prescribed, and urology offered if remains unsuccesful

## 2010-08-24 NOTE — Assessment & Plan Note (Addendum)
Chronic nausea, zofran as needed, gall bladder eval negative as far as stones, will re address the need for HIDA which was cancelled

## 2010-08-24 NOTE — Assessment & Plan Note (Signed)
Pt uncontroled with IDDM, will refer to endocrinology

## 2010-08-24 NOTE — Assessment & Plan Note (Signed)
Recent eye surgery has improved this

## 2010-09-30 ENCOUNTER — Telehealth: Payer: Self-pay | Admitting: Family Medicine

## 2010-10-02 NOTE — Telephone Encounter (Signed)
No antibiotics without ov. Please have schedule appt.

## 2010-10-04 NOTE — Telephone Encounter (Signed)
Pt called back and offered him appt. But he stated he would just wait.

## 2010-10-20 ENCOUNTER — Encounter: Payer: Self-pay | Admitting: Family Medicine

## 2010-10-20 ENCOUNTER — Ambulatory Visit (INDEPENDENT_AMBULATORY_CARE_PROVIDER_SITE_OTHER): Payer: PRIVATE HEALTH INSURANCE | Admitting: Family Medicine

## 2010-10-20 VITALS — BP 118/78 | HR 77 | Resp 16 | Ht 73.0 in | Wt 202.1 lb

## 2010-10-20 DIAGNOSIS — B370 Candidal stomatitis: Secondary | ICD-10-CM

## 2010-10-20 MED ORDER — FLUCONAZOLE 150 MG PO TABS: 150.0000 mg | ORAL_TABLET | Freq: Once | ORAL | Status: AC

## 2010-10-20 MED ORDER — NYSTATIN 100000 UNIT/ML MT SUSP: OROMUCOSAL | Status: DC

## 2010-10-20 NOTE — Assessment & Plan Note (Signed)
Will treat for acute thrush. Patient will get a loading dose of Diflucan and follow with nystatin swish and swallow until resolved. If he has fever increased lesions will need of recheck, would also check for other reasons for recurrence of thrush such as HIV. Patient has diabetes type 1 however his A1c is in the 7's per report.

## 2010-10-20 NOTE — Patient Instructions (Signed)
You are being treated for Thrush Take the diflucan as a loading dose medication for yeast Follow with the nystatin If you develop fever or worsening thrush please come back for a recheck

## 2010-10-20 NOTE — Progress Notes (Signed)
  Subjective:    Patient ID: Sean Buchanan, male    DOB: Feb 18, 1965, 45 y.o.   MRN: [id number]  HPI  Mouth pain and white patches x 1 week- history of candiasis approx 8 years ago, with uncontrolled diabetes, diabetes is currently not controlled, follows with endocrine Has pain with swallowing most foods, especially spicey foods, has been eating yogurt with pro-biotics, he had more white plaques earlier this week ROS- denies fever, cough, SOB, Nausea/Vomiting, change in stools, abd pain, no sick contacts No recent change in meds, no recent antibiotics or steroids Plans to get flu shot at work Has an eye surgery coming up in November    Review of Systems - per above      Objective:   Physical Exam GEN- NAD, alert and oriented x3 HEENT- PERRL, EOMI, MMM,white plaques on left buccal mucousa, mild erythema, no tonsillar exudates, soft white plaque on posterior tongue-easily scraped at bedside, fair dentition- cavities in molars Neck- Supple, Nodes- no cervical nodes  CVS- RRR, no murmur RESP-CTAB         Assessment & Plan:

## 2010-11-05 ENCOUNTER — Ambulatory Visit (INDEPENDENT_AMBULATORY_CARE_PROVIDER_SITE_OTHER): Payer: PRIVATE HEALTH INSURANCE | Admitting: Family Medicine

## 2010-11-05 ENCOUNTER — Encounter: Payer: Self-pay | Admitting: Family Medicine

## 2010-11-05 DIAGNOSIS — B370 Candidal stomatitis: Secondary | ICD-10-CM

## 2010-11-05 MED ORDER — ONDANSETRON 4 MG PO TBDP: 4.0000 mg | ORAL_TABLET | Freq: Two times a day (BID) | ORAL | Status: DC | PRN

## 2010-11-05 MED ORDER — FLUCONAZOLE 100 MG PO TABS: 100.0000 mg | ORAL_TABLET | Freq: Every day | ORAL | Status: AC

## 2010-11-05 NOTE — Patient Instructions (Addendum)
Take the diflucan 100mg  daily for 7 days  Monitor yourself for 1 week afterwards, if no improvement please call in. If you have fever, difficulty swallowing, or any changes please call for instructions

## 2010-11-07 ENCOUNTER — Other Ambulatory Visit: Payer: Self-pay | Admitting: Family Medicine

## 2010-11-07 NOTE — Assessment & Plan Note (Signed)
Treated for thrush, still has residual symptoms though no plaques seen. As I am unable to see far back into the oropharynx, will give 1 week treatment with diflucan daily. No current dysphagia or emesis. If this does not improve would send to ENT or GI to take a look.

## 2010-11-07 NOTE — Progress Notes (Signed)
  Subjective:    Patient ID: Sean Buchanan, male    DOB: 21-May-1965, 45 y.o.   MRN: [id number]  HPI  Pt seen for thrush approx 2 weeks ago, given 1 dose of diflucan and nystatin swish and swallow, he still feels he has something in his mouth and throat, no taste buds, tongue feels rough, denies dysphagia, N/V, fever, has not noticed any new white spots. No new illness- no cough, no sore throat   Review of Systems - per above     Objective:   Physical Exam GEN- NAD, alert and oriented x3 HEENT- PERRL, EOMI, slightly dry tongue with dry texture,no white plaques seen, mild erythema, no tonsillar exudates, fair dentition- cavities in molars, no oral abscess Neck- Supple, no LAD       Assessment & Plan:

## 2010-11-30 ENCOUNTER — Ambulatory Visit (INDEPENDENT_AMBULATORY_CARE_PROVIDER_SITE_OTHER): Payer: PRIVATE HEALTH INSURANCE | Admitting: Ophthalmology

## 2010-12-10 ENCOUNTER — Encounter: Payer: Self-pay | Admitting: Family Medicine

## 2010-12-16 ENCOUNTER — Encounter: Payer: PRIVATE HEALTH INSURANCE | Admitting: Family Medicine

## 2011-04-20 DIAGNOSIS — I639 Cerebral infarction, unspecified: Secondary | ICD-10-CM

## 2011-04-20 HISTORY — DX: Cerebral infarction, unspecified: I63.9

## 2011-04-27 ENCOUNTER — Ambulatory Visit (INDEPENDENT_AMBULATORY_CARE_PROVIDER_SITE_OTHER): Payer: PRIVATE HEALTH INSURANCE | Admitting: Family Medicine

## 2011-04-27 ENCOUNTER — Encounter: Payer: Self-pay | Admitting: Family Medicine

## 2011-04-27 VITALS — BP 136/80 | HR 87 | Resp 18 | Ht 73.0 in | Wt 207.0 lb

## 2011-04-27 DIAGNOSIS — E1065 Type 1 diabetes mellitus with hyperglycemia: Secondary | ICD-10-CM

## 2011-04-27 DIAGNOSIS — N529 Male erectile dysfunction, unspecified: Secondary | ICD-10-CM

## 2011-04-27 DIAGNOSIS — I639 Cerebral infarction, unspecified: Secondary | ICD-10-CM

## 2011-04-27 DIAGNOSIS — I10 Essential (primary) hypertension: Secondary | ICD-10-CM | POA: Insufficient documentation

## 2011-04-27 DIAGNOSIS — I6350 Cerebral infarction due to unspecified occlusion or stenosis of unspecified cerebral artery: Secondary | ICD-10-CM

## 2011-04-27 DIAGNOSIS — E785 Hyperlipidemia, unspecified: Secondary | ICD-10-CM

## 2011-04-27 MED ORDER — RAMIPRIL 5 MG PO CAPS: 5.0000 mg | ORAL_CAPSULE | Freq: Every day | ORAL | Status: DC

## 2011-04-27 NOTE — Patient Instructions (Signed)
F/u in 6 weeks. Please call if you need me before  You are referred to endocrinologist in [facility] per your request. PLEASE contact us as soon as possible with name and contact info of Doctor you want. Getting your blood sugar controlled is VITAL to your health  New is ramipril 5 mg one daily for blood pressure

## 2011-04-27 NOTE — Progress Notes (Signed)
  Subjective:    Patient ID: Sean Buchanan, male    DOB: 1965/01/28, 46 y.o.   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 Pt reports Bp was as high as 220/120. Blood sugars have remained uncontrolled and he has  not established with endo. Out of work till May 24 per neurology Getting Pt/OT in [place] was also hospitalized at [facility]   Review of Systems .See HPI Denies recent fever or chills. Denies sinus pressure, nasal congestion, ear pain or sore throat. Denies chest congestion, productive cough or wheezing. Denies chest pains, palpitations and leg swelling Denies abdominal pain, nausea, vomiting,diarrhea or constipation.   Denies dysuria, frequency, hesitancy or incontinence. Denies joint pain, swelling and limitation in mobility. C/o poor vision, has had laser treatment in the past for this and has more intervention planned for the near future, this is related to uncontrolled diabetes Pt experiencing anxiety and mild depression with regard to poor health, not suicidal or homicidal, refusing therapy at this time. Denies skin break down or rash.        Objective:   Physical Exam Patient alert and oriented and in no cardiopulmonary distress.  HEENT:Left facial weakness, EOMI, no sinus tenderness,  oropharynx pink and moist.  Neck supple no adenopathy.  Chest: Clear to auscultation bilaterally.  CVS: S1, S2 no murmurs, no S3.  ABD: Soft non tender. Bowel sounds normal.  Ext: No edema  MS: Adequate ROM spine, shoulders, hips and knees.  Skin: Intact, no ulcerations or rash noted.  Psych: Good eye contact, normal affect. Memory intact , mildly  anxious , depressed appearing and at times tearful.  CNS: Left facial weakness, reduced upper and lower extremity power on left side,  and sensation decreased in left upper and lower extremities       Assessment & Plan:

## 2011-05-01 ENCOUNTER — Encounter: Payer: Self-pay | Admitting: Family Medicine

## 2011-05-01 DIAGNOSIS — I639 Cerebral infarction, unspecified: Secondary | ICD-10-CM | POA: Insufficient documentation

## 2011-05-01 DIAGNOSIS — E1169 Type 2 diabetes mellitus with other specified complication: Secondary | ICD-10-CM | POA: Insufficient documentation

## 2011-05-01 DIAGNOSIS — E785 Hyperlipidemia, unspecified: Secondary | ICD-10-CM | POA: Insufficient documentation

## 2011-05-01 DIAGNOSIS — E1069 Type 1 diabetes mellitus with other specified complication: Secondary | ICD-10-CM | POA: Insufficient documentation

## 2011-05-01 HISTORY — DX: Cerebral infarction, unspecified: I63.9

## 2011-05-01 NOTE — Assessment & Plan Note (Signed)
Markedly uncntrolled , with systemic complications urgent endo eval, has been referred in the past , did not follow through

## 2011-05-01 NOTE — Assessment & Plan Note (Signed)
Vascular disease and uncontrolled IDDM

## 2011-05-01 NOTE — Assessment & Plan Note (Signed)
Reports recently marked elevation in his bP , which is slightly elevated at this visit, will start ACE, has been on this in the past , unsure why he discontinued the drug

## 2011-05-01 NOTE — Assessment & Plan Note (Signed)
Neurology at [facility] following pt, currently receiving OT and PT, reports improvement in symptoms

## 2011-05-01 NOTE — Assessment & Plan Note (Signed)
Continue medication for help with this problem

## 2011-06-01 ENCOUNTER — Telehealth: Payer: Self-pay | Admitting: Family Medicine

## 2011-06-01 NOTE — Telephone Encounter (Signed)
Are they ready?

## 2011-06-02 ENCOUNTER — Ambulatory Visit: Payer: PRIVATE HEALTH INSURANCE | Admitting: Rehabilitative and Restorative Service Providers"

## 2011-06-02 ENCOUNTER — Ambulatory Visit: Payer: PRIVATE HEALTH INSURANCE | Admitting: Occupational Therapy

## 2011-06-02 DIAGNOSIS — IMO0001 Reserved for inherently not codable concepts without codable children: Secondary | ICD-10-CM | POA: Insufficient documentation

## 2011-06-02 DIAGNOSIS — R269 Unspecified abnormalities of gait and mobility: Secondary | ICD-10-CM | POA: Insufficient documentation

## 2011-06-02 DIAGNOSIS — R42 Dizziness and giddiness: Secondary | ICD-10-CM | POA: Insufficient documentation

## 2011-06-08 ENCOUNTER — Ambulatory Visit (INDEPENDENT_AMBULATORY_CARE_PROVIDER_SITE_OTHER): Payer: PRIVATE HEALTH INSURANCE | Admitting: Family Medicine

## 2011-06-08 ENCOUNTER — Encounter: Payer: Self-pay | Admitting: Family Medicine

## 2011-06-08 VITALS — BP 122/84 | HR 78 | Resp 16 | Ht 73.0 in | Wt 215.4 lb

## 2011-06-08 DIAGNOSIS — I10 Essential (primary) hypertension: Secondary | ICD-10-CM

## 2011-06-08 DIAGNOSIS — I639 Cerebral infarction, unspecified: Secondary | ICD-10-CM

## 2011-06-08 DIAGNOSIS — I6350 Cerebral infarction due to unspecified occlusion or stenosis of unspecified cerebral artery: Secondary | ICD-10-CM

## 2011-06-08 DIAGNOSIS — E785 Hyperlipidemia, unspecified: Secondary | ICD-10-CM

## 2011-06-08 DIAGNOSIS — E1065 Type 1 diabetes mellitus with hyperglycemia: Secondary | ICD-10-CM

## 2011-06-08 NOTE — Patient Instructions (Signed)
F/u September, first week   Fasting lipid cmp, early Sept  Form has been completed

## 2011-06-08 NOTE — Progress Notes (Signed)
  Subjective:    Patient ID: Sean Buchanan, male    DOB: 1965-11-08, 46 y.o.   MRN: [id number]  HPI The PT is here for follow up and re-evaluation of chronic medical conditions, medication management and review of any available recent lab and radiology data.  Preventive health is updated, specifically  Cancer screening and Immunization.    The PT denies any adverse reactions to current medications since the last visit.  There are no new concerns.  There are no specific complaints  He is concerned about the fact that health coverage and income will soon run out, he has a form which I have completed at visit to help with his disability benefits. Still has significant neurologic symptoms , is in therapy regularly, has unsteady gait , unilateral weakness, poor memory and concentration Blood sugars continue to fluctuate and remain high, he is being followed by endocrinology, but his focus and attention on this problem is sub optimal , and apparently pales in the light of the recent disabling stroke though I explain again that both are related. i do believe he understands and is trying, just very overwhelmed at this time, and understandably so   Review of Systems See HPI Denies recent fever or chills. Denies sinus pressure, nasal congestion, ear pain or sore throat. Denies chest congestion, productive cough or wheezing. Denies chest pains, palpitations and leg swelling Denies abdominal pain, nausea, vomiting,diarrhea or constipation.   Denies dysuria, frequency, hesitancy or incontinence.  Denies headaches, seizures, numbness, or tingling.  Denies skin break down or rash.        Objective:   Physical Exam   Patient alert and oriented and in no cardiopulmonary distress.  HEENT:  facial asymmetry, with left sided weakness EOMI, no sinus tenderness,  oropharynx pink and moist.  Neck supple no adenopathy.  Chest: Clear to auscultation bilaterally.  CVS: S1, S2 no murmurs, no S3.  ABD:  Soft non tender. Bowel sounds normal.  Ext: No edema  MS: Adequate ROM spine, shoulders, hips and knees.  Skin: Intact, no ulcerations or rash noted.  Psych: Good eye contact, normal affect. Memory mild impairment in short term memory. anxious and  depressed appearing.  CNS: CN 2-12 intact, grade 3 to 4 power in left upper and lower extremities and dcreased sensation on left  .      Assessment & Plan:

## 2011-06-08 NOTE — Telephone Encounter (Signed)
asddresed at visit on 06/08/2011

## 2011-06-09 ENCOUNTER — Ambulatory Visit
Payer: PRIVATE HEALTH INSURANCE | Attending: Internal Medicine | Admitting: Rehabilitative and Restorative Service Providers"

## 2011-06-09 DIAGNOSIS — IMO0001 Reserved for inherently not codable concepts without codable children: Secondary | ICD-10-CM | POA: Insufficient documentation

## 2011-06-09 DIAGNOSIS — R42 Dizziness and giddiness: Secondary | ICD-10-CM | POA: Insufficient documentation

## 2011-06-09 DIAGNOSIS — R269 Unspecified abnormalities of gait and mobility: Secondary | ICD-10-CM | POA: Insufficient documentation

## 2011-06-12 NOTE — Assessment & Plan Note (Signed)
Currently being managed by endo sill reports poor control, and really does not appear too be capable of/focussing on necessary lifestyle changes and diligence with testing to improve this. The importance of both are stressed

## 2011-06-12 NOTE — Assessment & Plan Note (Signed)
Controlled, no change in medication  

## 2011-06-12 NOTE — Assessment & Plan Note (Signed)
Hyperlipidemia:Low fat diet discussed and encouraged.  Updated lab needed 

## 2011-06-12 NOTE — Assessment & Plan Note (Signed)
unsteady gait with hemiparesis a well as poor concentration and short term memory loss, pt incapable of returning to work in the near future, and currently in rehab as well as in the process of seeking help with disability and ongoing medical coverage, states both will run out soon

## 2011-06-13 ENCOUNTER — Ambulatory Visit: Payer: PRIVATE HEALTH INSURANCE | Admitting: Rehabilitative and Restorative Service Providers"

## 2011-06-17 ENCOUNTER — Ambulatory Visit: Payer: PRIVATE HEALTH INSURANCE | Admitting: Rehabilitative and Restorative Service Providers"

## 2011-06-20 ENCOUNTER — Encounter: Payer: PRIVATE HEALTH INSURANCE | Admitting: Rehabilitative and Restorative Service Providers"

## 2011-06-21 ENCOUNTER — Encounter: Payer: PRIVATE HEALTH INSURANCE | Admitting: Rehabilitative and Restorative Service Providers"

## 2011-06-22 ENCOUNTER — Ambulatory Visit: Payer: PRIVATE HEALTH INSURANCE | Admitting: Rehabilitative and Restorative Service Providers"

## 2011-06-27 ENCOUNTER — Ambulatory Visit: Payer: PRIVATE HEALTH INSURANCE | Admitting: Rehabilitative and Restorative Service Providers"

## 2011-06-27 ENCOUNTER — Telehealth: Payer: Self-pay | Admitting: Family Medicine

## 2011-06-27 NOTE — Telephone Encounter (Signed)
As long as there is a signature of his giving permission I think this is ok, route through office manager also pls to ensure ok

## 2011-06-29 ENCOUNTER — Ambulatory Visit: Payer: PRIVATE HEALTH INSURANCE | Admitting: Rehabilitative and Restorative Service Providers"

## 2011-07-25 ENCOUNTER — Telehealth: Payer: Self-pay

## 2011-07-25 NOTE — Telephone Encounter (Signed)
Let [employer] contact the neurologist, I will support their decision, also request that last niote from neurologist stating this be faxed here, if I need to release him. The note I last saw did not specify return to work

## 2011-07-25 NOTE — Telephone Encounter (Signed)
Called and left message with information.

## 2011-09-08 ENCOUNTER — Ambulatory Visit: Payer: PRIVATE HEALTH INSURANCE | Admitting: Family Medicine

## 2013-08-07 DIAGNOSIS — E269 Hyperaldosteronism, unspecified: Secondary | ICD-10-CM | POA: Insufficient documentation

## 2013-08-07 DIAGNOSIS — R55 Syncope and collapse: Secondary | ICD-10-CM | POA: Insufficient documentation

## 2013-08-07 DIAGNOSIS — E860 Dehydration: Secondary | ICD-10-CM | POA: Insufficient documentation

## 2016-08-08 ENCOUNTER — Ambulatory Visit: Payer: PRIVATE HEALTH INSURANCE | Admitting: Family Medicine

## 2016-08-09 ENCOUNTER — Other Ambulatory Visit: Payer: Self-pay | Admitting: Gastroenterology

## 2016-08-09 ENCOUNTER — Encounter: Payer: Self-pay | Admitting: Gastroenterology

## 2016-08-09 ENCOUNTER — Other Ambulatory Visit: Payer: Self-pay

## 2016-08-09 ENCOUNTER — Ambulatory Visit (INDEPENDENT_AMBULATORY_CARE_PROVIDER_SITE_OTHER): Payer: Commercial Managed Care - PPO | Admitting: Gastroenterology

## 2016-08-09 ENCOUNTER — Ambulatory Visit: Payer: PRIVATE HEALTH INSURANCE | Admitting: Nurse Practitioner

## 2016-08-09 DIAGNOSIS — K625 Hemorrhage of anus and rectum: Secondary | ICD-10-CM

## 2016-08-09 DIAGNOSIS — R1900 Intra-abdominal and pelvic swelling, mass and lump, unspecified site: Secondary | ICD-10-CM | POA: Diagnosis not present

## 2016-08-09 DIAGNOSIS — R194 Change in bowel habit: Secondary | ICD-10-CM

## 2016-08-09 HISTORY — DX: Change in bowel habit: R19.4

## 2016-08-09 MED ORDER — PANTOPRAZOLE SODIUM 40 MG PO TBEC: DELAYED_RELEASE_TABLET | ORAL | 11 refills | Status: DC

## 2016-08-09 NOTE — Assessment & Plan Note (Addendum)
ASSOCIATED WITH MELENA AND MUCOUSY/MUSHY BOWELS. DIFFERENTIAL DIAGNOSIS INCLUDES: PUD, AVMs, DIEULAFOY'S LESION, LESS LIKELY COLON OR GASTRIC CANCER, MESENTERIC ISCHEMIA,OR MECKEL'S DIVERTICULUM.  START PROTONIX. DAILY TO PREVENT ULCERS & GASTRITIS. COMPLETE CT SCAN ABD.PELVIS W/ IV AND PO CONTRAST WITHIN 7 DAYS. COMPLETE ENDOSCOPY WITHIN THE NEXT 2-3 WEEKS. DISCUSSED PROCEDURE, BENEFITS, & RISKS: < 1% chance of medication reaction, bleeding, perforation, or rupture of spleen/liver. OBTAIN LABS AND [place] FROM DR. FUSCO.  FOLLOW UP IN 4 MOS.

## 2016-08-09 NOTE — Progress Notes (Signed)
cc'ed to pcp °

## 2016-08-09 NOTE — Patient Instructions (Signed)
START PROTONIX. DAILY TO PREVENT ULCERS & GASTRITIS.  COMPLETE CT SCAN WITHIN 7 DAYS  COMPLETE ENDOSCOPY WITHIN THE NEXT 2-3 WEEKS.  FOLLOW UP IN 4 MOS.

## 2016-08-09 NOTE — Patient Instructions (Signed)
PA info for TCS/-/+EGD submitted via [facility] website. 

## 2016-08-09 NOTE — Progress Notes (Signed)
Noted diabetic med adjustment on procedure instructions.

## 2016-08-09 NOTE — Progress Notes (Signed)
TAKE HALF THE USUAL LANTUS DOSE ON THE NIGHT BEFORE ENDOSCOPY.  USE HUMALOG SLIDING SCALE ON MORNING OF ENDOSCOPY.

## 2016-08-09 NOTE — Progress Notes (Signed)
ON RECALL  °

## 2016-08-09 NOTE — Progress Notes (Addendum)
Subjective:    Patient ID: Sean Buchanan, male    DOB: 1965/04/22, 51 y.o.   MRN: [id number]  Redmond [employer], MD  HPI LAST YEAR FOUND A MASS ABOVE HIS UMBILICUS. DOESN'T THINK IT'S A HERNIA. WENT TO DR. Gerarda Fraction. GRADUALLY STARTED JAN/FEB 2018: SYMPTOMS ABOUT THE SAME. CHANGE ION BOWEL HABITS: NL-WENT AT LEAST ONCE A DAY. BLOOD WORK [facility]. ALK PHOS UP AND VIT D WAS LOW. RX VIT D AND DID MOR BLOOD WORK. SENT FOR [place] 2 WEEKS AGO AND HAS ABDOMINAL ABNORMALITY. MAY SEE BLOOD IN TOILET AND MUCOUS. STOOL LOOKS BLACK AND TARRY. LOOKS BLACK AND LONG. GUAIAC POS/NEG/THING.  CONSTIPATED TWICE: LAST YEAR ABD THIS YEAR. IRREGULAR BMs: 1 WK WITHOUT THEN FOLLOWING WEEK: MUSHY.  PAIN IN FEMUR AND HUMERUS AND SHINS BUT LAST A FEW MINS. NO TIGHTNESS FROM HATS. TRAVEL: NO. ABX: NO. WELL WATER: YES CAMPING: NO. NO SORES IN MOUTH, RASH ON LEGS. RARE BACK PAIN. STRESS BETTER THIS YEAR.  PT DENIES FEVER, CHILLS,  HEMATEMESIS, nausea, vomiting, diarrhea, CHEST PAIN, SHORTNESS OF BREATH, CHANGE IN BOWEL IN HABITS, abdominal pain, problems swallowing, problems with sedation, OR heartburn or indigestion.  Past Medical History:  Diagnosis Date  . Anxiety    chronic stress  . Diabetes mellitus 1983 dx   dx at ag 41  . ED (erectile dysfunction)   . Hyperlipidemia   . Hypertension   . IDDM (insulin dependent diabetes mellitus)   . Nausea    chronic  . 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 EYE SURGERY  2012   laser surgery to both eyes in July 18 and 26 , 2012  . KNEE ARTHROSCOPY  1991 approx   No Known Allergies  Current Outpatient Prescriptions  Medication Sig Dispense Refill  . aspirin 81 MG tablet Take 81 mg by mouth daily. ALMOST EVERY DAY   . atorvastatin (LIPITOR) 40 MG tablet Take 40 mg by mouth daily.    Marland Kitchen glucose blood (ONE TOUCH ULTRA TEST) test strip 1 each by Other route 3 (three) times daily. Use as  instructed     . Glucose Blood (ONETOUCH ULTRA BLUE VI) 3 (three) times daily.      . insulin aspart (NOVOLOG FLEXPEN) 100 UNIT/ML injection      . insulin glargine (LANTUS SOLOSTAR) 100 UNIT/ML injection Inject 30 Units into the skin daily. Dose increase affective 02/06/2010    . NOVOLOG FLEXPEN 100 UNIT/ML injection INJECT 20-25 UNITS FOUR TIMES DAILY BEFORE A MEAL (TOTAL DAILY DOSE OF 100 UNITS)    . ondansetron (ZOFRAN-ODT) 4 MG disintegrating tablet Take 1 tablet (4 mg total) by mouth 2 (two) times daily as needed. NONE IN ONE   . ramipril (ALTACE) 5 MG capsule Take 1 capsule (5 mg total) by mouth daily.    . vardenafil (LEVITRA) 20 MG tablet Take 20 mg by mouth. take one tablet by mouth 30 mins before intercourse  As needed      Family History  Problem Relation Age of Onset  . Diabetes Mother   . Hyperlipidemia Mother   . Hypertension Mother   . Rosacea Mother   . GER disease Father   . Hypertension Father   . Colon polyps Father   . Colon polyps Sister   . Diabetes Sister   . Colon cancer Neg Hx    Social History  Substance Use Topics  . Smoking status: Never  Smoker  . Smokeless tobacco: Never Used  . Alcohol use No   Review of Systems PER HPI OTHERWISE ALL SYSTEMS ARE NEGATIVE.    Objective:   Physical Exam  Constitutional: He is oriented to person, place, and time. He appears well-developed and well-nourished. No distress.  HENT:  Head: Normocephalic and atraumatic.  Mouth/Throat: Oropharynx is clear and moist. No oropharyngeal exudate.  Eyes: Pupils are equal, round, and reactive to light. No scleral icterus.  Neck: Normal range of motion. Neck supple.  Cardiovascular: Normal rate, regular rhythm and normal heart sounds.   Pulmonary/Chest: Effort normal and breath sounds normal. No respiratory distress.  Abdominal: Soft. Bowel sounds are normal. He exhibits no distension. There is no tenderness.  MILD TTP IN supraIUMBILICAL REGION associated with reducible bulge, no  increase with VALSALVA  Musculoskeletal: He exhibits no edema.  Lymphadenopathy:    He has no cervical adenopathy.  Neurological: He is alert and oriented to person, place, and time.  NO  NEW FOCAL DEFICITS  Skin: No rash noted.  Psychiatric: He has a normal mood and affect.  Vitals reviewed.     Assessment & Plan:

## 2016-08-10 LAB — CBC WITH DIFFERENTIAL/PLATELET
Basophils Absolute: 0 10*3/uL (ref 0.0–0.2)
Basos: 0 %
EOS (ABSOLUTE): 0.1 10*3/uL (ref 0.0–0.4)
EOS: 2 %
HEMATOCRIT: 43.9 % (ref 37.5–51.0)
Hemoglobin: 15.3 g/dL (ref 13.0–17.7)
Lymphocytes Absolute: 2.3 10*3/uL (ref 0.7–3.1)
Lymphs: 35 %
MCH: 28.6 pg (ref 26.6–33.0)
MCHC: 34.9 g/dL (ref 31.5–35.7)
MCV: 82 fL (ref 79–97)
MONOS ABS: 0.6 10*3/uL (ref 0.1–0.9)
Monocytes: 8 %
NEUTROS PCT: 55 %
Neutrophils Absolute: 3.6 10*3/uL (ref 1.4–7.0)
Platelets: 276 10*3/uL (ref 150–379)
RBC: 5.35 x10E6/uL (ref 4.14–5.80)
RDW: 14.5 % (ref 12.3–15.4)
WBC: 6.6 10*3/uL (ref 3.4–10.8)

## 2016-08-10 LAB — COMPREHENSIVE METABOLIC PANEL
ALBUMIN: 4.4 g/dL (ref 3.5–5.5)
ALT: 15 IU/L (ref 0–44)
AST: 14 IU/L (ref 0–40)
Albumin/Globulin Ratio: 1.8 (ref 1.2–2.2)
Alkaline Phosphatase: 142 IU/L — ABNORMAL HIGH (ref 39–117)
BILIRUBIN TOTAL: 0.5 mg/dL (ref 0.0–1.2)
BUN / CREAT RATIO: 16 (ref 9–20)
BUN: 13 mg/dL (ref 6–24)
CO2: 27 mmol/L (ref 20–29)
CREATININE: 0.83 mg/dL (ref 0.76–1.27)
Calcium: 9.5 mg/dL (ref 8.7–10.2)
Chloride: 104 mmol/L (ref 96–106)
GFR calc non Af Amer: 104 mL/min/{1.73_m2} (ref >59)
GFR, EST AFRICAN AMERICAN: 120 mL/min/{1.73_m2} (ref >59)
GLUCOSE: 170 mg/dL — AB (ref 65–99)
Globulin, Total: 2.5 g/dL (ref 1.5–4.5)
Potassium: 4.1 mmol/L (ref 3.5–5.2)
Sodium: 139 mmol/L (ref 134–144)
TOTAL PROTEIN: 6.9 g/dL (ref 6.0–8.5)

## 2016-08-10 NOTE — Patient Instructions (Signed)
Called [facility] and submitted PA info for CT abd/pelvis with contrast. Case approved, PA# [id number], valid 08/12/16-09/10/16.

## 2016-08-11 ENCOUNTER — Other Ambulatory Visit: Payer: Self-pay

## 2016-08-11 ENCOUNTER — Telehealth: Payer: Self-pay

## 2016-08-11 NOTE — Telephone Encounter (Signed)
Called [facility] to follow-up on PA for tcs/egd that was submitted online 08/09/16. Case approved. PA# [id number], 08/30/16-09/29/16.

## 2016-08-11 NOTE — Progress Notes (Signed)
Magda Paganini, would you review these results in Dr. Oneida Alar' absence?

## 2016-08-12 ENCOUNTER — Ambulatory Visit (HOSPITAL_COMMUNITY)
Admission: RE | Admit: 2016-08-12 | Discharge: 2016-08-12 | Disposition: A | Discharge status: Home / Self Care | Payer: Commercial Managed Care - PPO | Source: Ambulatory Visit | Attending: Gastroenterology | Admitting: Gastroenterology

## 2016-08-12 DIAGNOSIS — N200 Calculus of kidney: Secondary | ICD-10-CM | POA: Insufficient documentation

## 2016-08-12 DIAGNOSIS — K625 Hemorrhage of anus and rectum: Secondary | ICD-10-CM | POA: Diagnosis not present

## 2016-08-12 DIAGNOSIS — R194 Change in bowel habit: Secondary | ICD-10-CM | POA: Diagnosis not present

## 2016-08-12 DIAGNOSIS — R1900 Intra-abdominal and pelvic swelling, mass and lump, unspecified site: Secondary | ICD-10-CM | POA: Diagnosis not present

## 2016-08-12 MED ORDER — IOPAMIDOL (ISOVUE-300) INJECTION 61%
100.0000 mL | Freq: Once | INTRAVENOUS | Status: AC | PRN
  Administered 2016-08-12: 100 mL via INTRAVENOUS

## 2016-08-14 NOTE — Progress Notes (Signed)
Please let patient know glucose and alk phos elevated. Glucose 170.  Save for further input from [facility].

## 2016-08-15 NOTE — Progress Notes (Signed)
PT is aware of results and that we will notify of further input from Dr. Oneida Alar.

## 2016-08-16 NOTE — Progress Notes (Signed)
LMOM to call.

## 2016-08-16 NOTE — Progress Notes (Signed)
Pt is aware.  

## 2016-08-30 ENCOUNTER — Ambulatory Visit (HOSPITAL_COMMUNITY)
Admission: RE | Admit: 2016-08-30 | Discharge: 2016-08-30 | Disposition: A | Discharge status: Home / Self Care | Payer: Commercial Managed Care - PPO | Source: Ambulatory Visit | Attending: Gastroenterology | Admitting: Gastroenterology

## 2016-08-30 ENCOUNTER — Encounter (HOSPITAL_COMMUNITY): Payer: Self-pay | Admitting: *Deleted

## 2016-08-30 ENCOUNTER — Encounter (HOSPITAL_COMMUNITY)
Admission: RE | Disposition: A | Discharge status: Home / Self Care | Payer: Self-pay | Source: Ambulatory Visit | Attending: Gastroenterology

## 2016-08-30 DIAGNOSIS — E785 Hyperlipidemia, unspecified: Secondary | ICD-10-CM | POA: Insufficient documentation

## 2016-08-30 DIAGNOSIS — F419 Anxiety disorder, unspecified: Secondary | ICD-10-CM | POA: Insufficient documentation

## 2016-08-30 DIAGNOSIS — K921 Melena: Secondary | ICD-10-CM | POA: Diagnosis not present

## 2016-08-30 DIAGNOSIS — E119 Type 2 diabetes mellitus without complications: Secondary | ICD-10-CM | POA: Diagnosis not present

## 2016-08-30 DIAGNOSIS — Z794 Long term (current) use of insulin: Secondary | ICD-10-CM | POA: Diagnosis not present

## 2016-08-30 DIAGNOSIS — Z7982 Long term (current) use of aspirin: Secondary | ICD-10-CM | POA: Diagnosis not present

## 2016-08-30 DIAGNOSIS — R194 Change in bowel habit: Secondary | ICD-10-CM

## 2016-08-30 DIAGNOSIS — D124 Benign neoplasm of descending colon: Secondary | ICD-10-CM | POA: Diagnosis not present

## 2016-08-30 DIAGNOSIS — Z79899 Other long term (current) drug therapy: Secondary | ICD-10-CM | POA: Insufficient documentation

## 2016-08-30 DIAGNOSIS — I10 Essential (primary) hypertension: Secondary | ICD-10-CM | POA: Diagnosis not present

## 2016-08-30 DIAGNOSIS — K625 Hemorrhage of anus and rectum: Secondary | ICD-10-CM

## 2016-08-30 DIAGNOSIS — I69854 Hemiplegia and hemiparesis following other cerebrovascular disease affecting left non-dominant side: Secondary | ICD-10-CM | POA: Diagnosis not present

## 2016-08-30 DIAGNOSIS — R110 Nausea: Secondary | ICD-10-CM | POA: Insufficient documentation

## 2016-08-30 DIAGNOSIS — R197 Diarrhea, unspecified: Secondary | ICD-10-CM | POA: Diagnosis not present

## 2016-08-30 DIAGNOSIS — K2970 Gastritis, unspecified, without bleeding: Secondary | ICD-10-CM | POA: Insufficient documentation

## 2016-08-30 DIAGNOSIS — K648 Other hemorrhoids: Secondary | ICD-10-CM | POA: Diagnosis not present

## 2016-08-30 DIAGNOSIS — R634 Abnormal weight loss: Secondary | ICD-10-CM | POA: Diagnosis not present

## 2016-08-30 DIAGNOSIS — K317 Polyp of stomach and duodenum: Secondary | ICD-10-CM | POA: Diagnosis not present

## 2016-08-30 HISTORY — PX: COLONOSCOPY: SHX5424

## 2016-08-30 HISTORY — PX: ESOPHAGOGASTRODUODENOSCOPY: SHX5428

## 2016-08-30 LAB — GLUCOSE, CAPILLARY: GLUCOSE-CAPILLARY: 140 mg/dL — AB (ref 65–99)

## 2016-08-30 SURGERY — COLONOSCOPY
Anesthesia: Moderate Sedation

## 2016-08-30 MED ORDER — MEPERIDINE HCL 100 MG/ML IJ SOLN
INTRAMUSCULAR | Status: AC
  Filled 2016-08-30: qty 2

## 2016-08-30 MED ORDER — LIDOCAINE VISCOUS 2 % MT SOLN
OROMUCOSAL | Status: AC
  Filled 2016-08-30: qty 15

## 2016-08-30 MED ORDER — PROMETHAZINE HCL 25 MG/ML IJ SOLN
12.5000 mg | Freq: Once | INTRAMUSCULAR | Status: AC
  Administered 2016-08-30: 12.5 mg via INTRAVENOUS

## 2016-08-30 MED ORDER — EPINEPHRINE PF 1 MG/10ML IJ SOSY
PREFILLED_SYRINGE | INTRAMUSCULAR | Status: AC
  Filled 2016-08-30: qty 10

## 2016-08-30 MED ORDER — SPOT INK MARKER SYRINGE KIT
PACK | SUBMUCOSAL | Status: AC
  Filled 2016-08-30: qty 5

## 2016-08-30 MED ORDER — SODIUM CHLORIDE 0.9 % IJ SOLN
INTRAMUSCULAR | Status: DC | PRN
  Administered 2016-08-30: 2 mL

## 2016-08-30 MED ORDER — STERILE WATER FOR IRRIGATION IR SOLN
Status: DC | PRN
  Administered 2016-08-30: 100 mL

## 2016-08-30 MED ORDER — PROMETHAZINE HCL 25 MG/ML IJ SOLN
INTRAMUSCULAR | Status: AC
  Filled 2016-08-30: qty 1

## 2016-08-30 MED ORDER — SPOT INK MARKER SYRINGE KIT
PACK | SUBMUCOSAL | Status: DC | PRN
  Administered 2016-08-30: 4 mL via SUBMUCOSAL

## 2016-08-30 MED ORDER — SODIUM CHLORIDE 0.9 % IV SOLN
INTRAVENOUS | Status: DC
  Administered 2016-08-30: 12:00:00 via INTRAVENOUS

## 2016-08-30 MED ORDER — MIDAZOLAM HCL 5 MG/5ML IJ SOLN
INTRAMUSCULAR | Status: AC
  Filled 2016-08-30: qty 10

## 2016-08-30 MED ORDER — MEPERIDINE HCL 100 MG/ML IJ SOLN
INTRAMUSCULAR | Status: DC | PRN
  Administered 2016-08-30 (×5): 25 mg via INTRAVENOUS

## 2016-08-30 MED ORDER — SODIUM CHLORIDE 0.9% FLUSH
INTRAVENOUS | Status: AC
  Filled 2016-08-30: qty 10

## 2016-08-30 MED ORDER — LIDOCAINE VISCOUS 2 % MT SOLN
OROMUCOSAL | Status: DC | PRN
  Administered 2016-08-30: 4 mL via OROMUCOSAL

## 2016-08-30 MED ORDER — MIDAZOLAM HCL 5 MG/5ML IJ SOLN
INTRAMUSCULAR | Status: DC | PRN
  Administered 2016-08-30 (×3): 2 mg via INTRAVENOUS
  Administered 2016-08-30: 1 mg via INTRAVENOUS

## 2016-08-30 NOTE — H&P (View-Only) (Signed)
Subjective:    Patient ID: Sean Buchanan, male    DOB: 17-Sep-1965, 51 y.o.   MRN: [id number]  [employer]mond [employer], MD  HPI LAST YEAR FOUND A MASS ABOVE HIS UMBILICUS. DOESN'T THINK IT'S A HERNIA. WENT TO DR. Gerarda Fraction. GRADUALLY STARTED JAN/FEB 2018: SYMPTOMS ABOUT THE SAME. CHANGE ION BOWEL HABITS: NL-WENT AT LEAST ONCE A DAY. BLOOD WORK [facility]. ALK PHOS UP AND VIT D WAS LOW. RX VIT D AND DID MOR BLOOD WORK. SENT FOR [place] 2 WEEKS AGO AND HAS ABDOMINAL ABNORMALITY. MAY SEE BLOOD IN TOILET AND MUCOUS. STOOL LOOKS BLACK AND TARRY. LOOKS BLACK AND LONG. GUAIAC POS/NEG/THING.  CONSTIPATED TWICE: LAST YEAR ABD THIS YEAR. IRREGULAR BMs: 1 WK WITHOUT THEN FOLLOWING WEEK: MUSHY.  PAIN IN FEMUR AND HUMERUS AND SHINS BUT LAST A FEW MINS. NO TIGHTNESS FROM HATS. TRAVEL: NO. ABX: NO. WELL WATER: YES CAMPING: NO. NO SORES IN MOUTH, RASH ON LEGS. RARE BACK PAIN. STRESS BETTER THIS YEAR.  PT DENIES FEVER, CHILLS,  HEMATEMESIS, nausea, vomiting, diarrhea, CHEST PAIN, SHORTNESS OF BREATH, CHANGE IN BOWEL IN HABITS, abdominal pain, problems swallowing, problems with sedation, OR heartburn or indigestion.  Past Medical History:  Diagnosis Date  . Anxiety    chronic stress  . Diabetes mellitus 1983 dx   dx at ag 77  . ED (erectile dysfunction)   . Hyperlipidemia   . Hypertension   . IDDM (insulin dependent diabetes mellitus)   . Nausea    chronic  . 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 EYE SURGERY  2012   laser surgery to both eyes in July 18 and 26 , 2012  . KNEE ARTHROSCOPY  1991 approx   No Known Allergies  Current Outpatient Prescriptions  Medication Sig Dispense Refill  . aspirin 81 MG tablet Take 81 mg by mouth daily. ALMOST EVERY DAY   . atorvastatin (LIPITOR) 40 MG tablet Take 40 mg by mouth daily.    Marland Kitchen glucose blood (ONE TOUCH ULTRA TEST) test strip 1 each by Other route 3 (three) times daily. Use as  instructed     . Glucose Blood (ONETOUCH ULTRA BLUE VI) 3 (three) times daily.      . insulin aspart (NOVOLOG FLEXPEN) 100 UNIT/ML injection      . insulin glargine (LANTUS SOLOSTAR) 100 UNIT/ML injection Inject 30 Units into the skin daily. Dose increase affective 02/06/2010    . NOVOLOG FLEXPEN 100 UNIT/ML injection INJECT 20-25 UNITS FOUR TIMES DAILY BEFORE A MEAL (TOTAL DAILY DOSE OF 100 UNITS)    . ondansetron (ZOFRAN-ODT) 4 MG disintegrating tablet Take 1 tablet (4 mg total) by mouth 2 (two) times daily as needed. NONE IN ONE   . ramipril (ALTACE) 5 MG capsule Take 1 capsule (5 mg total) by mouth daily.    . vardenafil (LEVITRA) 20 MG tablet Take 20 mg by mouth. take one tablet by mouth 30 mins before intercourse  As needed      Family History  Problem Relation Age of Onset  . Diabetes Mother   . Hyperlipidemia Mother   . Hypertension Mother   . Rosacea Mother   . GER disease Father   . Hypertension Father   . Colon polyps Father   . Colon polyps Sister   . Diabetes Sister   . Colon cancer Neg Hx    Social History  Substance Use Topics  . Smoking status: Never  Smoker  . Smokeless tobacco: Never Used  . Alcohol use No   Review of Systems PER HPI OTHERWISE ALL SYSTEMS ARE NEGATIVE.    Objective:   Physical Exam  Constitutional: He is oriented to person, place, and time. He appears well-developed and well-nourished. No distress.  HENT:  Head: Normocephalic and atraumatic.  Mouth/Throat: Oropharynx is clear and moist. No oropharyngeal exudate.  Eyes: Pupils are equal, round, and reactive to light. No scleral icterus.  Neck: Normal range of motion. Neck supple.  Cardiovascular: Normal rate, regular rhythm and normal heart sounds.   Pulmonary/Chest: Effort normal and breath sounds normal. No respiratory distress.  Abdominal: Soft. Bowel sounds are normal. He exhibits no distension. There is no tenderness.  MILD TTP IN supraIUMBILICAL REGION associated with reducible bulge, no  increase with VALSALVA  Musculoskeletal: He exhibits no edema.  Lymphadenopathy:    He has no cervical adenopathy.  Neurological: He is alert and oriented to person, place, and time.  NO  NEW FOCAL DEFICITS  Skin: No rash noted.  Psychiatric: He has a normal mood and affect.  Vitals reviewed.     Assessment & Plan:

## 2016-08-30 NOTE — Op Note (Addendum)
[facility] Patient Name: Sean Buchanan Procedure Date: 08/30/2016 1:01 PM MRN: [id number] Date of Birth: 1965-10-12 Attending MD: Barney Drain , MD CSN: [id number] Age: 51 Admit Type: Outpatient Procedure:                Colonoscopy with WITH COLD SNARE &                            EMR-SPOT/EPI/SALINE INJECTION Indications:              Clinically significant diarrhea of unexplained                            origin, Hematochezia Providers:                Barney Drain, MD, Rosina Lowenstein, RN, Randa Spike,                            Technician Referring MD:             Redmond School, MD Medicines:                Promethazine 12.5 mg IV, Meperidine 100 mg IV,                            Midazolam 7 mg IV Complications:            No immediate complications. Estimated Blood Loss:     Estimated blood loss was minimal. Procedure:                Pre-Anesthesia Assessment:                           - Prior to the procedure, a History and Physical                            was performed, and patient medications and                            allergies were reviewed. The patient's tolerance of                            previous anesthesia was also reviewed. The risks                            and benefits of the procedure and the sedation                            options and risks were discussed with the patient.                            All questions were answered, and informed consent                            was obtained. Prior Anticoagulants: The patient has  taken aspirin. ASA Grade Assessment: II - A patient                            with mild systemic disease. After reviewing the                            risks and benefits, the patient was deemed in                            satisfactory condition to undergo the procedure.                            After obtaining informed consent, the colonoscope                            was passed under  direct vision. Throughout the                            procedure, the patient's blood pressure, pulse, and                            oxygen saturations were monitored continuously. The                            [id number]) scope was introduced through the                            anus and advanced to the 5 cm into the ileum. The                            colonoscopy was technically difficult and complex                            due to restricted mobility of the colon. Successful                            completion of the procedure was aided by increasing                            the dose of sedation medication and COLOWRAP. The                            patient tolerated the procedure fairly well. The                            quality of the bowel preparation was good. The                            terminal ileum, ileocecal valve, appendiceal                            orifice, and rectum were photographed. Scope In: 1:15:39 PM Scope Out: 2:16:48 PM Scope Withdrawal Time: 0  hours 54 minutes 10 seconds  Total Procedure Duration: 1 hour 1 minute 9 seconds  Findings:      The terminal ileum appeared normal.      A 5 mm polyp was found in the proximal descending colon. The polyp was       sessile. The polyp was removed with a cold snare. Resection and       retrieval were complete.      A 20 mm polyp was found in the mid descending colon. The polyp was flat.       Area was successfully injected with 2 mL of a 1:10,000 solution of       epinephrine for hemostasis. Area was tattooed with an injection of 4 mL       of Spot (carbon black). The polyp was removed with a cold biopsy       forceps. The polyp was removed with a piecemeal technique using a hot       snare. Resection and retrieval were complete. To prevent bleeding after       the polypectomy, two hemostatic clips were successfully placed (MR       conditional). There was no bleeding at the end of the procedure.        Coagulation for destruction of remaining portion of lesion using argon       plasma at 0.5 liters/minute and 20 watts was successful. Area was       successfully injected with 8 mL saline for a lift polypectomy.      External and internal hemorrhoids were found during retroflexion. The       hemorrhoids were moderate. Impression:               - The examined portion of the ileum was normal.                           - One 5 mm polyp in the proximal descending colon,                            removed with a cold snare. Resected and retrieved.                           - One 20 mm polyp in the mid descending colon,                            removed with a cold biopsy forceps and removed                            piecemeal using a hot snare. Resected and                            retrieved. Injected. Tattooed. Clips (MR                            conditional) were placed.                           - External and internal hemorrhoids. Moderate Sedation:      Moderate (conscious) sedation was administered by the endoscopy nurse  and supervised by the endoscopist. The following parameters were       monitored: oxygen saturation, heart rate, blood pressure, and response       to care. Total physician intraservice time was 94 minutes. Recommendation:           - Repeat colonoscopy in 6 months- ONE YEAR for                            surveillance. ALL FIRST DEGREE RELATIVES NEED TCS                            AT AGE 68 AND EVERY 5 YRS.                           - High fiber diet.                           - Continue present medications.                           - Await pathology results.                           - Return to my office in 4 months.                           - Patient has a contact number available for                            emergencies. The signs and symptoms of potential                            delayed complications were discussed with the                             patient. Return to normal activities tomorrow.                            Written discharge instructions were provided to the                            patient. Procedure Code(s):        --- Professional ---                           [phone], Colonoscopy, flexible; with removal of                            tumor(s), polyp(s), or other lesion(s) by snare                            technique                           99152, Moderate sedation services provided by the  same physician or other qualified health care                            professional performing the diagnostic or                            therapeutic service that the sedation supports,                            requiring the presence of an independent trained                            observer to assist in the monitoring of the                            patient's level of consciousness and physiological                            status; initial 15 minutes of intraservice time,                            patient age 104 years or older                           [phone], Moderate sedation services; each additional                            15 minutes intraservice time                           99153, Moderate sedation services; each additional                            15 minutes intraservice time                           99153, Moderate sedation services; each additional                            15 minutes intraservice time                           99153, Moderate sedation services; each additional                            15 minutes intraservice time                           99153, Moderate sedation services; each additional                            15 minutes intraservice time Diagnosis Code(s):        --- Professional ---                           K64.8, Other hemorrhoids  D12.4, Benign neoplasm of descending colon                           R19.7,  Diarrhea, unspecified                           K92.1, Melena (includes Hematochezia) CPT copyright 2016 [employer]. All rights reserved. The codes documented in this report are preliminary and upon coder review may  be revised to meet current compliance requirements. Barney Drain, MD Barney Drain, MD 08/30/2016 2:51:55 PM This report has been signed electronically. Number of Addenda: 0

## 2016-08-30 NOTE — Interval H&P Note (Signed)
History and Physical Interval Note:  08/30/2016 12:31 PM  Sean Buchanan  has presented today for surgery, with the diagnosis of rectal bleeding, change in bowel habits  The various methods of treatment have been discussed with the patient and family. After consideration of risks, benefits and other options for treatment, the patient has consented to  Procedure(s) with comments: COLONOSCOPY (N/A) - 12:00pm ESOPHAGOGASTRODUODENOSCOPY (EGD) (N/A) as a surgical intervention .  The patient's history has been reviewed, patient examined, no change in status, stable for surgery.  I have reviewed the patient's chart and labs.  Questions were answered to the patient's satisfaction.     [employer]

## 2016-08-30 NOTE — Op Note (Signed)
[facility] Patient Name: Sean Buchanan Procedure Date: 08/30/2016 1:00 PM MRN: [id number] Date of Birth: Aug 15, 1965 Attending MD: Barney Drain , MD CSN: [id number] Age: 51 Admit Type: Outpatient Procedure:                Upper GI endoscopy with cold forceps biopsy Indications:              Diarrhea, Weight loss Providers:                Barney Drain, MD, Rosina Lowenstein, RN, Randa Spike,                            Technician Referring MD:             Redmond School, MD Medicines:                Midazolam 2 mg IV Complications:            No immediate complications. Estimated Blood Loss:     Estimated blood loss was minimal. Procedure:                Pre-Anesthesia Assessment:                           - Prior to the procedure, a History and Physical                            was performed, and patient medications and                            allergies were reviewed. The patient's tolerance of                            previous anesthesia was also reviewed. The risks                            and benefits of the procedure and the sedation                            options and risks were discussed with the patient.                            All questions were answered, and informed consent                            was obtained. Prior Anticoagulants: The patient has                            taken aspirin. ASA Grade Assessment: II - A patient                            with mild systemic disease. After reviewing the                            risks and benefits, the patient was deemed in  satisfactory condition to undergo the procedure.                            After obtaining informed consent, the endoscope was                            passed under direct vision. Throughout the                            procedure, the patient's blood pressure, pulse, and                            oxygen saturations were monitored continuously. The                   [id number]-[id number] ([id number]) scope was introduced through the                            mouth, and advanced to the second part of duodenum.                            The upper GI endoscopy was technically difficult                            and complex due to the patient's agitation.                            Successful completion of the procedure was aided by                            increasing the dose of sedation medication. The                            patient tolerated the procedure fairly well. Scope In: 2:25:46 PM Scope Out: 2:38:17 PM Total Procedure Duration: 0 hours 12 minutes 31 seconds  Findings:      The examined esophagus was normal.      Patchy mild inflammation characterized by congestion (edema) and       erythema was found in the gastric antrum. Biopsies were taken with a       cold forceps for Helicobacter pylori testing.      Multiple small sessile polyps were found in the cardia. This was       biopsied with a cold forceps for histology.      The examined duodenum was normal. Biopsies for histology were taken with       a cold forceps for evaluation of celiac disease. Impression:               - NO SOURCE FOR DIARRHEA IDENTIFIED                           - MILD Gastritis.                           - Multiple gastric polyps. Moderate Sedation:      Moderate (conscious) sedation was administered by the endoscopy nurse  and supervised by the endoscopist. The following parameters were       monitored: oxygen saturation, heart rate, blood pressure, and response       to care. Total physician intraservice time was 94 minutes. Recommendation:           - Await pathology results.                           - Continue present medications.                           - Resume previous diet.                           - Return to my office in 4 months.                           - Patient has a contact number available for                             emergencies. The signs and symptoms of potential                            delayed complications were discussed with the                            patient. Return to normal activities tomorrow.                            Written discharge instructions were provided to the                            patient. Procedure Code(s):        --- Professional ---                           [phone], Esophagogastroduodenoscopy, flexible,                            transoral; with biopsy, single or multiple                           99152, Moderate sedation services provided by the                            same physician or other qualified health care                            professional performing the diagnostic or                            therapeutic service that the sedation supports,                            requiring the presence of an independent trained  observer to assist in the monitoring of the                            patient's level of consciousness and physiological                            status; initial 15 minutes of intraservice time,                            patient age 48 years or older                           [phone], Moderate sedation services; each additional                            15 minutes intraservice time                           99153, Moderate sedation services; each additional                            15 minutes intraservice time                           99153, Moderate sedation services; each additional                            15 minutes intraservice time                           99153, Moderate sedation services; each additional                            15 minutes intraservice time                           99153, Moderate sedation services; each additional                            15 minutes intraservice time Diagnosis Code(s):        --- Professional ---                           K29.70, Gastritis, unspecified,  without bleeding                           K31.7, Polyp of stomach and duodenum                           R19.7, Diarrhea, unspecified                           R63.4, Abnormal weight loss CPT copyright 2016 [employer]. All rights reserved. The codes documented in this report are preliminary and upon coder review may  be revised to meet current compliance requirements. Barney Drain, MD Barney Drain, MD 08/30/2016 2:56:48 PM This report has  been signed electronically. Number of Addenda: 0

## 2016-09-01 ENCOUNTER — Telehealth: Payer: Self-pay | Admitting: Gastroenterology

## 2016-09-01 NOTE — Telephone Encounter (Signed)
PT is aware of the results.  

## 2016-09-01 NOTE — Telephone Encounter (Signed)
ON RECALL FOR TCS AND OFFICE VISIT

## 2016-09-01 NOTE — Discharge Instructions (Signed)
YOUR RECTAL BLEEDING IS DUE TO HEMORRHOIDS AND THE LARGE POLYP. NO SOURCE FOR YOUR DIARRHEA WAS IDENTIFIED. You have MODERATE internal hemorrhoids AND EXTERNAL HEMORRHOIDS.  YOU HAD TWO polyps removed. You have mild gastritis AND SMALL STOMACH POLYPS. I biopsied your stomach AND SMALL BOWEL.   Before having an MRI, LET THE DOCTOR AND TECH KNOW YOU HAVE TWO METAL CLIPS IN YOUR COLON. THEY SHOULD FALL OFF IN 30 DAYS.  DRINK WATER TO KEEP YOUR URINE LIGHT YELLOW.  FOLLOW A HIGH FIBER/DIABETIC DIET. AVOID ITEMS THAT CAUSE BLOATING. SEE INFO BELOW.  YOUR BIOPSY RESULTS WILL BE AVAILABLE IN MY CHART AFTER AUG 31 AND MY OFFICE WILL CONTACT YOU IN 10-14 DAYS WITH YOUR RESULTS.   USE PREPARATION H FOUR TIMES  A DAY IF NEEDED TO RELIEVE RECTAL PAIN/PRESSURE/BLEEDING.  FOLLOW UP IN 4 MOS.   Next colonoscopy in 6 MOS TO 1 year. YOUR SISTERS, BROTHERS, CHILDREN, AND PARENTS NEED TO HAVE A COLONOSCOPY STARTING AT THE AGE OF 40 and then every 5 years   ENDOSCOPY Care After Read the instructions outlined below and refer to this sheet in the next week. These discharge instructions provide you with general information on caring for yourself after you leave the hospital. While your treatment has been planned according to the most current medical practices available, unavoidable complications occasionally occur. If you have any problems or questions after discharge, call DR. Omelia Marquart, [phone].  ACTIVITY  You may resume your regular activity, but move at a slower pace for the next 24 hours.   Take frequent rest periods for the next 24 hours.   Walking will help get rid of the air and reduce the bloated feeling in your belly (abdomen).   No driving for 24 hours (because of the medicine (anesthesia) used during the test).   You may shower.   Do not sign any important legal documents or operate any machinery for 24 hours (because of the anesthesia used during the test).    NUTRITION  Drink plenty  of fluids.   You may resume your normal diet as instructed by your doctor.   Begin with a light meal and progress to your normal diet. Heavy or fried foods are harder to digest and may make you feel sick to your stomach (nauseated).   Avoid alcoholic beverages for 24 hours or as instructed.    MEDICATIONS  You may resume your normal medications.   WHAT YOU CAN EXPECT TODAY  Some feelings of bloating in the abdomen.   Passage of more gas than usual.   Spotting of blood in your stool or on the toilet paper  .  IF YOU HAD POLYPS REMOVED DURING THE ENDOSCOPY:  Eat a soft diet IF YOU HAVE NAUSEA, BLOATING, ABDOMINAL PAIN, OR VOMITING.    FINDING OUT THE RESULTS OF YOUR TEST Not all test results are available during your visit. DR. Oneida Alar WILL CALL YOU WITHIN 14 DAYS OF YOUR PROCEDUE WITH YOUR RESULTS. Do not assume everything is normal if you have not heard from DR. Song Myre, CALL HER OFFICE AT [phone].  SEEK IMMEDIATE MEDICAL ATTENTION AND CALL THE OFFICE: [phone] IF:  You have more than a spotting of blood in your stool.   Your belly is swollen (abdominal distention).   You are nauseated or vomiting.   You have a temperature over 101F.   You have abdominal pain or discomfort that is severe or gets worse throughout the day.  Polyps, Colon  A polyp is extra tissue that  grows inside your body. Colon polyps grow in the large intestine. The large intestine, also called the colon, is part of your digestive system. It is a long, hollow tube at the end of your digestive tract where your body makes and stores stool. Most polyps are not dangerous. They are benign. This means they are not cancerous. But over time, some types of polyps can turn into cancer. Polyps that are smaller than a pea are usually not harmful. But larger polyps could someday become or may already be cancerous. To be safe, doctors remove all polyps and test them.   WHO GETS POLYPS? Anyone can get polyps,  but certain people are more likely than others. You may have a greater chance of getting polyps if:  You are over 50.   You have had polyps before.   Someone in your family has had polyps.   Someone in your family has had cancer of the large intestine.   Find out if someone in your family has had polyps. You may also be more likely to get polyps if you:   Eat a lot of fatty foods   Smoke   Drink alcohol   Do not exercise  Eat too much   PREVENTION There is not one sure way to prevent polyps. You might be able to lower your risk of getting them if you:  Eat more fruits and vegetables and less fatty food.   Do not smoke.   Avoid alcohol.   Exercise every day.   Lose weight if you are overweight.   Eating more calcium and folate can also lower your risk of getting polyps. Some foods that are rich in calcium are milk, cheese, and broccoli. Some foods that are rich in folate are chickpeas, kidney beans, and spinach.    Gastritis  Gastritis is an inflammation (the body's way of reacting to injury and/or infection) of the stomach. It is often caused by viral or bacterial (germ) infections. It can also be caused BY ASPIRIN, BC/GOODY POWDER'S, (IBUPROFEN) MOTRIN, OR ALEVE (NAPROXEN), chemicals (including alcohol), SPICY FOODS, and medications. This illness may be associated with generalized malaise (feeling tired, not well), UPPER ABDOMINAL STOMACH cramps, and fever. One common bacterial cause of gastritis is an organism known as H. Pylori. This can be treated with antibiotics.    High-Fiber Diet A high-fiber diet changes your normal diet to include more whole grains, legumes, fruits, and vegetables. Changes in the diet involve replacing refined carbohydrates with unrefined foods. The calorie level of the diet is essentially unchanged. The Dietary Reference Intake (recommended amount) for adult males is 38 grams per day. For adult females, it is 25 grams per day. Pregnant and lactating  women should consume 28 grams of fiber per day. Fiber is the intact part of a plant that is not broken down during digestion. Functional fiber is fiber that has been isolated from the plant to provide a beneficial effect in the body.  PURPOSE  Increase stool bulk.   Ease and regulate bowel movements.   Lower cholesterol.   REDUCE RISK OF COLON CANCER  INDICATIONS THAT YOU NEED MORE FIBER  Constipation and hemorrhoids.   Uncomplicated diverticulosis (intestine condition) and irritable bowel syndrome.   Weight management.   As a protective measure against hardening of the arteries (atherosclerosis), diabetes, and cancer.   GUIDELINES FOR INCREASING FIBER IN THE DIET  Start adding fiber to the diet slowly. A gradual increase of about 5 more grams (2 slices of whole-wheat  bread, 2 servings of most fruits or vegetables, or 1 bowl of high-fiber cereal) per day is best. Too rapid an increase in fiber may result in constipation, flatulence, and bloating.   Drink enough water and fluids to keep your urine clear or pale yellow. Water, juice, or caffeine-free drinks are recommended. Not drinking enough fluid may cause constipation.   Eat a variety of high-fiber foods rather than one type of fiber.   Try to increase your intake of fiber through using high-fiber foods rather than fiber pills or supplements that contain small amounts of fiber.   The goal is to change the types of food eaten. Do not supplement your present diet with high-fiber foods, but replace foods in your present diet.   INCLUDE A VARIETY OF FIBER SOURCES  Replace refined and processed grains with whole grains, canned fruits with fresh fruits, and incorporate other fiber sources. White rice, white breads, and most bakery goods contain little or no fiber.   Brown whole-grain rice, buckwheat oats, and many fruits and vegetables are all good sources of fiber. These include: broccoli, Brussels sprouts, cabbage, cauliflower,  beets, sweet potatoes, white potatoes (skin on), carrots, tomatoes, eggplant, squash, berries, fresh fruits, and dried fruits.   Cereals appear to be the richest source of fiber. Cereal fiber is found in whole grains and bran. Bran is the fiber-rich outer coat of cereal grain, which is largely removed in refining. In whole-grain cereals, the bran remains. In breakfast cereals, the largest amount of fiber is found in those with "bran" in their names. The fiber content is sometimes indicated on the label.   You may need to include additional fruits and vegetables each day.   In baking, for 1 cup white flour, you may use the following substitutions:   1 cup whole-wheat flour minus 2 tablespoons.   1/2 cup white flour plus 1/2 cup whole-wheat flour.   Hemorrhoids Hemorrhoids are dilated (enlarged) veins around the rectum. Sometimes clots will form in the veins. This makes them swollen and painful. These are called thrombosed hemorrhoids. Causes of hemorrhoids include:  Constipation.   Straining to have a bowel movement.   HEAVY LIFTING  HOME CARE INSTRUCTIONS  Eat a well balanced diet and drink 6 to 8 glasses of water every day to avoid constipation. You may also use a bulk laxative.   Avoid straining to have bowel movements.   Keep anal area dry and clean.   Do not use a donut shaped pillow or sit on the toilet for long periods. This increases blood pooling and pain.   Move your bowels when your body has the urge; this will require less straining and will decrease pain and pressure.

## 2016-09-01 NOTE — Telephone Encounter (Signed)
Reminder in epic °

## 2016-09-01 NOTE — Telephone Encounter (Signed)
Please call pt. HE had ONE LARGE AND ONE SMALL simple adenomas removed.HIS stomach Bx shows mild gastritis AND BENIGN STOMACH POLYPS. HIS LOOSE STOOLS ARE MOST LIKELY DUE TO DAMAGE FROM DIABETRS AND LACTOSE INTOLERANCE. HE DOES NOT HAVE CELIAC SPRUE.   Before having an MRI, LET THE DOCTOR AND TECH KNOW YOU HAVE TWO METAL CLIPS IN YOUR COLON. THEY SHOULD FALL OFF IN 30 DAYS.  DRINK WATER TO KEEP YOUR URINE LIGHT YELLOW.  FOLLOW A HIGH FIBER/DIABETIC/LACTOSE FREE DIET. AVOID ITEMS THAT CAUSE BLOATING. Pick up a handout or we can email IT TO HIM.   USE PREPARATION H FOUR TIMES  A DAY IF NEEDED TO RELIEVE RECTAL PAIN/PRESSURE/BLEEDING.  FOLLOW UP IN 4 MOS E30 change in bowel habits.   Next colonoscopy in 3 years. YOUR SISTERS, BROTHERS, CHILDREN, AND PARENTS NEED TO HAVE A COLONOSCOPY STARTING AT THE AGE OF 40 and then every 5 years

## 2016-09-06 ENCOUNTER — Encounter (HOSPITAL_COMMUNITY): Payer: Self-pay | Admitting: Gastroenterology

## 2016-10-26 ENCOUNTER — Encounter: Payer: Self-pay | Admitting: Gastroenterology

## 2016-11-15 ENCOUNTER — Encounter: Payer: Self-pay | Admitting: Gastroenterology

## 2017-10-18 ENCOUNTER — Encounter: Payer: Self-pay | Admitting: Podiatry

## 2017-10-18 ENCOUNTER — Ambulatory Visit (INDEPENDENT_AMBULATORY_CARE_PROVIDER_SITE_OTHER): Payer: Commercial Managed Care - PPO | Admitting: Podiatry

## 2017-10-18 VITALS — BP 151/91

## 2017-10-18 DIAGNOSIS — B351 Tinea unguium: Secondary | ICD-10-CM

## 2017-10-18 MED ORDER — TERBINAFINE HCL 250 MG PO TABS: 250.0000 mg | ORAL_TABLET | Freq: Every day | ORAL | 0 refills | Status: DC

## 2017-10-26 NOTE — Progress Notes (Signed)
   Subjective: 52 year old male with PMHx of T1DM presenting today as a new patient with a chief complaint of possible fungus of the right great toenail that has been present for the past year. He denies any modifying factors. He has been soaking the feet in Epsom salt and Betadine. Patient is here for further evaluation and treatment.   Past Medical History:  Diagnosis Date  . Anxiety    chronic stress  . ED (erectile dysfunction)   . Hyperlipidemia   . Hypertension   . IDDM (insulin dependent diabetes mellitus) ([place]) 1983   AGE 52  . Retinopathy, DIABETIC   . Stroke ([place]) 04/20/2011   left facial weakness, and left hemiparesis    Objective: Physical Exam General: The patient is alert and oriented x3 in no acute distress.  Dermatology: Hyperkeratotic, discolored, thickened, onychodystrophy of nails noted bilaterally.  Skin is warm, dry and supple bilateral lower extremities. Negative for open lesions or macerations.  Vascular: Palpable pedal pulses bilaterally. No edema or erythema noted. Capillary refill within normal limits.  Neurological: Epicritic and protective threshold grossly intact bilaterally.   Musculoskeletal Exam: Range of motion within normal limits to all pedal and ankle joints bilateral. Muscle strength 5/5 in all groups bilateral.   Assessment: #1 onychodystrophy bilateral toenails #2 possible onychomycosis #3 hyperkeratotic nails bilateral  Plan of Care:  #1 Patient was evaluated. #2 Prescription for Lamisil 250 mg #90 provided to patient.  #3 Patient will call for appointment if he would like to pursue laser treatment.  #4 Return to clinic as needed.   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    Edrick Kins, DPM [facility]  Dr. Edrick Kins, [address]                                        [place], [place] [address]                Office [phone]  Fax [phone]

## 2017-11-28 ENCOUNTER — Ambulatory Visit: Payer: Commercial Managed Care - PPO | Admitting: Podiatry

## 2018-03-23 ENCOUNTER — Other Ambulatory Visit: Payer: Self-pay

## 2018-03-23 ENCOUNTER — Ambulatory Visit (INDEPENDENT_AMBULATORY_CARE_PROVIDER_SITE_OTHER): Payer: Commercial Managed Care - PPO | Admitting: Podiatry

## 2018-03-23 DIAGNOSIS — B351 Tinea unguium: Secondary | ICD-10-CM | POA: Diagnosis not present

## 2018-03-23 NOTE — Patient Instructions (Signed)

## 2018-03-29 ENCOUNTER — Encounter: Payer: Self-pay | Admitting: Podiatry

## 2018-03-29 NOTE — Progress Notes (Signed)
Subjective: Sean Buchanan presents today for preventative diabetic foot care. He has h/o type I diabetes, diagnosed at age 53. He has completed 90 day course of terbinafine for onychomycosis.  Redmond School, MD is his PCP.    Current Outpatient Medications:  .  aspirin EC 81 MG tablet, Take by mouth., Disp: , Rfl:  .  glucose blood (ONE TOUCH ULTRA TEST) test strip, 1 each by Other route 4 (four) times daily. Use as instructed , Disp: , Rfl:  .  Glucose Blood (ONETOUCH ULTRA BLUE VI), 1 each by Other route 4 (four) times daily. , Disp: , Rfl:  .  insulin aspart (NOVOLOG) 100 UNIT/ML injection, Inject into the skin., Disp: , Rfl:  .  insulin glargine (LANTUS SOLOSTAR) 100 UNIT/ML injection, Inject 44 Units into the skin at bedtime. , Disp: , Rfl:  .  insulin lispro (HUMALOG) 100 UNIT/ML cartridge, Inject 10-30 Units into the skin 3 (three) times daily with meals. Per sliding scale (averages ~20 units per meal), Disp: , Rfl:  .  naproxen sodium (ANAPROX) 220 MG tablet, Take 440 mg by mouth 2 (two) times daily as needed (for pain.)., Disp: , Rfl:  .  ondansetron (ZOFRAN-ODT) 4 MG disintegrating tablet, Take by mouth., Disp: , Rfl:  .  pantoprazole (PROTONIX) 40 MG tablet, 1 PO 30 MINUTES PRIOR TO MEALS QD (Patient taking differently: Take 40 mg by mouth daily before breakfast. ), Disp: 31 tablet, Rfl: 11 .  sildenafil (VIAGRA) 100 MG tablet, Take 100 mg by mouth See admin instructions., Disp: , Rfl:  .  terbinafine (LAMISIL) 250 MG tablet, Take 1 tablet (250 mg total) by mouth daily., Disp: 90 tablet, Rfl: 0 .  Vitamin D, Ergocalciferol, (DRISDOL) 50000 units CAPS capsule, Take 50,000 Units by mouth every Monday. , Disp: , Rfl:   No Known Allergies  Objective:  Vascular Examination: Capillary refill time immediate x 10 digits  Dorsalis pedis and Posterior tibial pulses palpable b/l  Digital hair present x 10 digits  Skin temperature gradient WNL b/l  Dermatological Examination: Skin  with normal turgor, texture and tone b/l  Toenails with discoloration of b/l great toes noted medial and lateral borders with subungual debris. Clearing noted proximal 2/3 of nailplates.   Musculoskeletal: Muscle strength 5/5 to all LE muscle groups  No gross bony deformities b/l.  No pain, crepitus or joint limitation noted with ROM.   Neurological: Sensation intact with 10 gram monofilament.  Vibratory sensation intact.  Assessment: Onychymycosis toenails 1-5 b/l responding to oral terbinafine therapy IDDM  Plan: 1. Patient advised nails will have to grow out, but there are favorable results noticed with his course of therapy. Nails trimmed as a courtesy. 2. Patient to continue soft, supportive shoe gear daily. 3. Patient to report any pedal injuries to medical professional immediately. 4. Follow up 3 months for re-evaluation of oral therapy.  5. Patient/POA to call should there be a concern in the interim.

## 2018-06-22 ENCOUNTER — Ambulatory Visit: Payer: Commercial Managed Care - PPO | Admitting: Podiatry

## 2018-06-27 ENCOUNTER — Encounter (HOSPITAL_COMMUNITY): Payer: Self-pay | Admitting: Emergency Medicine

## 2018-06-27 ENCOUNTER — Emergency Department (HOSPITAL_COMMUNITY)
Admission: EM | Admit: 2018-06-27 | Discharge: 2018-06-27 | Disposition: A | Discharge status: Home / Self Care | Payer: Commercial Managed Care - PPO | Attending: Emergency Medicine | Admitting: Emergency Medicine

## 2018-06-27 ENCOUNTER — Emergency Department (HOSPITAL_COMMUNITY): Payer: Commercial Managed Care - PPO

## 2018-06-27 ENCOUNTER — Other Ambulatory Visit: Payer: Self-pay

## 2018-06-27 DIAGNOSIS — Z7982 Long term (current) use of aspirin: Secondary | ICD-10-CM | POA: Insufficient documentation

## 2018-06-27 DIAGNOSIS — I10 Essential (primary) hypertension: Secondary | ICD-10-CM | POA: Insufficient documentation

## 2018-06-27 DIAGNOSIS — R51 Headache: Secondary | ICD-10-CM | POA: Diagnosis not present

## 2018-06-27 DIAGNOSIS — E109 Type 1 diabetes mellitus without complications: Secondary | ICD-10-CM | POA: Diagnosis not present

## 2018-06-27 DIAGNOSIS — H538 Other visual disturbances: Secondary | ICD-10-CM | POA: Insufficient documentation

## 2018-06-27 DIAGNOSIS — E1069 Type 1 diabetes mellitus with other specified complication: Secondary | ICD-10-CM

## 2018-06-27 DIAGNOSIS — Z79899 Other long term (current) drug therapy: Secondary | ICD-10-CM | POA: Insufficient documentation

## 2018-06-27 DIAGNOSIS — E10319 Type 1 diabetes mellitus with unspecified diabetic retinopathy without macular edema: Secondary | ICD-10-CM | POA: Insufficient documentation

## 2018-06-27 DIAGNOSIS — H539 Unspecified visual disturbance: Secondary | ICD-10-CM

## 2018-06-27 LAB — COMPREHENSIVE METABOLIC PANEL
ALT: 18 U/L (ref 0–44)
AST: 22 U/L (ref 15–41)
Albumin: 3.9 g/dL (ref 3.5–5.0)
Alkaline Phosphatase: 150 U/L — ABNORMAL HIGH (ref 38–126)
Anion gap: 10 (ref 5–15)
BUN: 16 mg/dL (ref 6–20)
CO2: 24 mmol/L (ref 22–32)
Calcium: 8.8 mg/dL — ABNORMAL LOW (ref 8.9–10.3)
Chloride: 101 mmol/L (ref 98–111)
Creatinine, Ser: 0.95 mg/dL (ref 0.61–1.24)
GFR calc Af Amer: >60 mL/min (ref >60)
GFR calc non Af Amer: >60 mL/min (ref >60)
Glucose, Bld: 318 mg/dL — ABNORMAL HIGH (ref 70–99)
Potassium: 4 mmol/L (ref 3.5–5.1)
Sodium: 135 mmol/L (ref 135–145)
Total Bilirubin: 0.5 mg/dL (ref 0.3–1.2)
Total Protein: 7.6 g/dL (ref 6.5–8.1)

## 2018-06-27 LAB — CBC
HCT: 47.5 % (ref 39.0–52.0)
Hemoglobin: 15.1 g/dL (ref 13.0–17.0)
MCH: 27.5 pg (ref 26.0–34.0)
MCHC: 31.8 g/dL (ref 30.0–36.0)
MCV: 86.4 fL (ref 80.0–100.0)
Platelets: 285 10*3/uL (ref 150–400)
RBC: 5.5 MIL/uL (ref 4.22–5.81)
RDW: 13.2 % (ref 11.5–15.5)
WBC: 6 10*3/uL (ref 4.0–10.5)
nRBC: 0 % (ref 0.0–0.2)

## 2018-06-27 LAB — C-REACTIVE PROTEIN: CRP: 1.1 mg/dL — ABNORMAL HIGH (ref <1.0)

## 2018-06-27 LAB — CBG MONITORING, ED
Glucose-Capillary: 276 mg/dL — ABNORMAL HIGH (ref 70–99)
Glucose-Capillary: 279 mg/dL — ABNORMAL HIGH (ref 70–99)

## 2018-06-27 LAB — SEDIMENTATION RATE: Sed Rate: NEGATIVE mm/hr (ref 0–16)

## 2018-06-27 NOTE — ED Notes (Signed)
Patient transported to CT 

## 2018-06-27 NOTE — Discharge Instructions (Addendum)
Go to eye doctor's office immediately.  When you get into the parking, lot call his cell phone number which is [phone].  He will open the door to the building.  Office is on the [address] and [address] across from [facility].

## 2018-06-27 NOTE — ED Provider Notes (Addendum)
[facility] EMERGENCY DEPARTMENT Provider Note   CSN: [id number] Arrival date & time: 06/27/18  1837    History   Chief Complaint Chief Complaint  Patient presents with  . Eye Problem  . Headache    HPI Sean Buchanan is a 53 y.o. male.     Level 5 caveat for urgency of condition.  Patient is a type I diabetic for multiple years with a visual disturbance in the right eye at approximately 1815 tonight.  He had pain behind his right eye followed by a large black area in his visual field and a sensation that he is looking through [place].  No complaints from left eye.  No motor or sensory deficits, facial asymmetry, confusion, speech difficulties.  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     Past Medical History:  Diagnosis Date  . Anxiety    chronic stress  . ED (erectile dysfunction)   . Hyperlipidemia   . Hypertension   . IDDM (insulin dependent diabetes mellitus) ([place]) 1983   AGE 45  . Retinopathy, DIABETIC   . Stroke ([place]) 04/20/2011   left facial weakness, and left hemiparesis    Patient Active Problem List   Diagnosis Date Noted  . Adenomatous polyp of descending colon   . Change in bowel habits 08/09/2016  . 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 Syncope 08/07/2013  . Hyperlipidemia LDL goal < 70 05/01/2011  . CVA (cerebral vascular accident) ([place]) 05/01/2011  . HTN, goal below 130/80 04/27/2011  . Thrush 10/20/2010  . UNSPECIFIED VISUAL LOSS 02/02/2010  . 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 NAUSEA 11/23/2009  . DISORDER OF BONE AND CARTILAGE UNSPECIFIED 10/14/2009  . DIABETES MELLITUS, TYPE I, UNCONTROLLED 10/13/2009    Past Surgical History:  Procedure Laterality Date  . COLONOSCOPY N/A 08/30/2016   Procedure: COLONOSCOPY;  Surgeon: Danie Binder, MD;  Location: [facility] ENDO SUITE;  Service: Endoscopy;  Laterality: N/A;  12:00pm  . ESOPHAGOGASTRODUODENOSCOPY N/A 08/30/2016   Procedure: ESOPHAGOGASTRODUODENOSCOPY (EGD);  Surgeon:  Danie Binder, MD;  Location: [facility] ENDO SUITE;  Service: Endoscopy;  Laterality: N/A;  . EYE SURGERY  2012   laser surgery to both eyes in July 18 and 26 , 2012  . KNEE ARTHROSCOPY  1991 approx        Home Medications    Prior to Admission medications   Medication Sig Start Date End Date Taking? Authorizing Provider  aspirin EC 81 MG tablet Take by mouth.    [provider]  glucose blood (ONE TOUCH ULTRA TEST) test strip 1 each by Other route 4 (four) times daily. Use as instructed     [provider]  Glucose Blood (ONETOUCH ULTRA BLUE VI) 1 each by Other route 4 (four) times daily.     [provider]  insulin aspart (NOVOLOG) 100 UNIT/ML injection Inject into the skin.    [provider]  insulin glargine (LANTUS SOLOSTAR) 100 UNIT/ML injection Inject 44 Units into the skin at bedtime.     [provider]  insulin lispro (HUMALOG) 100 UNIT/ML cartridge Inject 10-30 Units into the skin 3 (three) times daily with meals. Per sliding scale (averages ~20 units per meal)    [provider]  naproxen sodium (ANAPROX) 220 MG tablet Take 440 mg by mouth 2 (two) times daily as needed (for pain.).    [provider]  ondansetron (ZOFRAN-ODT) 4 MG disintegrating tablet Take by mouth.    [provider]  pantoprazole ([place])  40 MG tablet 1 PO 30 MINUTES PRIOR TO MEALS QD Patient taking differently: Take 40 mg by mouth daily before breakfast.  08/09/16   Fields, Marga Melnick, MD  sildenafil (VIAGRA) 100 MG tablet Take 100 mg by mouth See admin instructions. 02/26/18   [provider]  terbinafine (LAMISIL) 250 MG tablet Take 1 tablet (250 mg total) by mouth daily. 10/18/17   Edrick Kins, DPM  Vitamin D, Ergocalciferol, (DRISDOL) 50000 units CAPS capsule Take 50,000 Units by mouth every Monday.     [provider]    Family History Family History  Problem Relation Age of Onset  . Diabetes Mother   .  Hyperlipidemia Mother   . Hypertension Mother   . Rosacea Mother   . GER disease Father   . Hypertension Father   . Colon polyps Father   . Colon polyps Sister   . Diabetes Sister   . Colon cancer Neg Hx     Social History Social History   Tobacco Use  . Smoking status: Never Smoker  . Smokeless tobacco: Never Used  Substance Use Topics  . Alcohol use: No  . Drug use: No     Allergies   Patient has no known allergies.   Review of Systems Review of Systems  Unable to perform ROS: Acuity of condition     Physical Exam Updated Vital Signs BP (!) 199/102   Pulse 96   Temp 98 F (36.7 C) (Oral)   Resp 18   Ht 6' (1.829 m)   Wt 99.8 kg   SpO2 96%   BMI 29.84 kg/m   Physical Exam Vitals signs and nursing note reviewed.  Constitutional:      Appearance: He is well-developed.  HENT:     Head: Normocephalic and atraumatic.  Eyes:     Conjunctiva/sclera: Conjunctivae normal.     Comments: Impaired vision from right eye.  No obvious anomaly on retinal exam.  Neck:     Musculoskeletal: Neck supple.  Cardiovascular:     Rate and Rhythm: Normal rate and regular rhythm.  Pulmonary:     Effort: Pulmonary effort is normal.     Breath sounds: Normal breath sounds.  Abdominal:     General: Bowel sounds are normal.     Palpations: Abdomen is soft.  Musculoskeletal: Normal range of motion.  Skin:    General: Skin is warm and dry.  Neurological:     Mental Status: He is alert and oriented to person, place, and time.  Psychiatric:        Behavior: Behavior normal.      ED Treatments / Results  Labs (all labs ordered are listed, but only abnormal results are displayed) Labs Reviewed  COMPREHENSIVE METABOLIC PANEL - Abnormal; Notable for the following components:      Result Value   Glucose, Bld 318 (*)    Calcium 8.8 (*)    Alkaline Phosphatase 150 (*)    All other components within normal limits  CBG MONITORING, ED - Abnormal; Notable for the following  components:   Glucose-Capillary 276 (*)    All other components within normal limits  CBG MONITORING, ED - Abnormal; Notable for the following components:   Glucose-Capillary 279 (*)    All other components within normal limits  CBC  SEDIMENTATION RATE  C-REACTIVE PROTEIN    EKG None  Radiology Ct Head Wo Contrast  Result Date: 06/27/2018 CLINICAL DATA:  Vision loss right eye EXAM: CT HEAD WITHOUT CONTRAST TECHNIQUE:  Contiguous axial images were obtained from the base of the skull through the vertex without intravenous contrast. COMPARISON:  CT brain 09/11/2015 FINDINGS: Brain: No acute territorial infarction, hemorrhage or intracranial mass. Normal ventricle size. Vascular: No hyperdense vessel or unexpected calcification. Scattered calcifications at the carotid siphons. Skull: Normal. Negative for fracture or focal lesion. Sinuses/Orbits: Mild mucosal thickening in the maxillary sinuses. Other: None IMPRESSION: Negative non contrasted CT appearance of the brain. Electronically Signed   By: Donavan Foil M.D.   On: 06/27/2018 19:26    Procedures Procedures (including critical care time)  Medications Ordered in ED Medications - No data to display   Initial Impression / Assessment and Plan / ED Course  I have reviewed the triage vital signs and the nursing notes.  Pertinent labs & imaging results that were available during my care of the patient were reviewed by me and considered in my medical decision making (see chart for details).        Type I diabetic with abrupt onset of right eye visual disturbance.  Consult ophthalmology.  2020: Discussed with ophthalmologist Dr.Bevis.  He will see patient immediately in [place].  Sed rate and C-reactive protein added to blood work.  Final Clinical Impressions(s) / ED Diagnoses   Final diagnoses:  Visual disturbance  Type 1 diabetes mellitus with other specified complication [facility])    ED Discharge Orders    None        Nat Christen, MD 06/27/18 Deberah Pelton, MD 06/27/18 2026

## 2018-06-27 NOTE — ED Triage Notes (Signed)
Pt states " I sat down at my computer and the vision in my right eye went blurry and I feel like I am looking through a stained glass"  Pt hypertensive in triage.

## 2019-06-18 ENCOUNTER — Encounter: Payer: Self-pay | Admitting: Gastroenterology

## 2019-11-13 ENCOUNTER — Other Ambulatory Visit: Payer: Self-pay

## 2019-11-13 ENCOUNTER — Encounter: Payer: Self-pay | Admitting: Family Medicine

## 2019-11-13 ENCOUNTER — Ambulatory Visit (INDEPENDENT_AMBULATORY_CARE_PROVIDER_SITE_OTHER): Payer: Commercial Managed Care - PPO | Admitting: Family Medicine

## 2019-11-13 VITALS — BP 156/88 | HR 94 | Temp 97.7°F | Ht 72.0 in | Wt 219.0 lb

## 2019-11-13 DIAGNOSIS — E1169 Type 2 diabetes mellitus with other specified complication: Secondary | ICD-10-CM | POA: Diagnosis not present

## 2019-11-13 DIAGNOSIS — I10 Essential (primary) hypertension: Secondary | ICD-10-CM

## 2019-11-13 DIAGNOSIS — E785 Hyperlipidemia, unspecified: Secondary | ICD-10-CM | POA: Diagnosis not present

## 2019-11-13 DIAGNOSIS — E10319 Type 1 diabetes mellitus with unspecified diabetic retinopathy without macular edema: Secondary | ICD-10-CM

## 2019-11-13 DIAGNOSIS — E1165 Type 2 diabetes mellitus with hyperglycemia: Secondary | ICD-10-CM

## 2019-11-13 LAB — POCT GLYCOSYLATED HEMOGLOBIN (HGB A1C): Hemoglobin A1C: 8.1 % — AB (ref 4.0–5.6)

## 2019-11-13 MED ORDER — HUMALOG 100 UNIT/ML ~~LOC~~ SOCT: 10.0000 [IU] | Freq: Three times a day (TID) | SUBCUTANEOUS | 3 refills | Status: DC

## 2019-11-13 MED ORDER — TOUJEO MAX SOLOSTAR 300 UNIT/ML ~~LOC~~ SOPN: 40.0000 [IU] | PEN_INJECTOR | Freq: Every day | SUBCUTANEOUS | 3 refills | Status: DC

## 2019-11-13 MED ORDER — ROSUVASTATIN CALCIUM 10 MG PO TABS: 5.0000 mg | ORAL_TABLET | Freq: Every day | ORAL | 1 refills | Status: DC

## 2019-11-13 MED ORDER — LISINOPRIL 10 MG PO TABS: 5.0000 mg | ORAL_TABLET | Freq: Every day | ORAL | 1 refills | Status: DC

## 2019-11-13 NOTE — Patient Instructions (Addendum)
  HAPPY FALL!  I appreciate the opportunity to provide you with care for your health and wellness. Today we discussed: established care   Follow up: Feb for CPE -fasting   No labs or referrals today  A1c was good! So we will continue all that you are currently on.  Nice to meet you today!  Have a great Holiday Season!  Please continue to practice social distancing to keep you, your family, and our community safe.  If you must go out, please wear a mask and practice good handwashing.  It was a pleasure to see you and I look forward to continuing to work together on your health and well-being. Please do not hesitate to call the office if you need care or have questions about your care.  Have a wonderful day and week. With Gratitude, Cherly Beach, DNP, AGNP-BC

## 2019-11-13 NOTE — Progress Notes (Signed)
Subjective:  Patient ID: Sean Buchanan, male    DOB: 1965/05/01  Age: 54 y.o. MRN: [id number]  CC:  Chief Complaint  Patient presents with  . New Patient (Initial Visit)    here to establish care. wants to discuss b/p med and diabetes med      HPI  HPI  Sean Buchanan is a very pleasant 54 year old gentleman who presents today to establish care. Has a history that includes but is not limited to insulin-dependent diabetes type 1 diagnosed at the age of 54.,  Hyperlipidemia, hypertension.  Reports he is never been started on blood pressure medicine but has been taking his sisters ACE inhibitor because he knows the benefits of it for diabetics.  We will just be put on his medicines in the past without much regard per him.  He needs to have his A1c checked has been 3 months.  He is on Toujeo and Humalog.  He reports that his biggest factor is his eating habits.  He knows he does not eat very well.  He does not cook he lives with his folks his mom sometimes cook food but overall he eats a lot of fast food.  He denies having any peripheral neuropathy-like symptoms.  Denies having any current blurred vision but does have a diagnosis of retinopathy.  Does see his eye doctor regularly.  Is up-to-date on all vaccines.  Had an abnormal colonoscopy in 2018.  He will be following up with them once they reach back out who believes is about time for him to have it rechecked.  He does not know if he is ever had a prostate level checked.  No family history of prostate issues.  Is up-to-date on all vaccines.  Does work as a Audiological scientist.  Very stressful position.  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  He denies having any active chest pain, shortness of breath, cough, headaches, dizziness, vision changes, recent sick contacts  Today patient denies signs and symptoms of COVID 19 infection including fever, chills, cough, shortness of breath, and headache. Past  Medical, Surgical, Social History, Allergies, and Medications have been Reviewed.   Past Medical History:  Diagnosis Date  . Adenomatous polyp of descending colon   . Anxiety    chronic stress  . Change in bowel habits 08/09/2016  . CVA (cerebral vascular accident) ([place]) 05/01/2011   Acute CVA 04/2011, with residual left facial weakness, and left hemiparesis   . DISORDER OF BONE AND CARTILAGE UNSPECIFIED 10/14/2009   Qualifier: Diagnosis of  By: Cori Razor LPN, Brandi    . ED (erectile dysfunction)   . 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 Hyperlipidemia   . Hypertension   . IDDM (insulin dependent diabetes mellitus) 1983   AGE 63  . Retinopathy, DIABETIC   . Stroke ([place]) 04/20/2011   left facial weakness, and left hemiparesis    Current Meds  Medication Sig  . aspirin EC 81 MG tablet Take by mouth.  . insulin glargine, 2 Unit Dial, (TOUJEO MAX SOLOSTAR) 300 UNIT/ML Solostar Pen Inject 40 Units into the skin daily.  . insulin lispro (HUMALOG) 100 UNIT/ML cartridge Inject 0.1-0.3 mLs (10-30 Units total) into the skin 3 (three) times daily with meals. Per sliding scale (averages ~20 units per meal)  . ondansetron (ZOFRAN-ODT) 4 MG disintegrating tablet Take by mouth.  . [DISCONTINUED] insulin glargine,  2 Unit Dial, (TOUJEO MAX SOLOSTAR) 300 UNIT/ML Solostar Pen Inject 40 Units into the skin daily.  . [DISCONTINUED] insulin lispro (HUMALOG) 100 UNIT/ML cartridge Inject 10-30 Units into the skin 3 (three) times daily with meals. Per sliding scale (averages ~20 units per meal)  . [DISCONTINUED] sildenafil (VIAGRA) 100 MG tablet Take 100 mg by mouth See admin instructions.    ROS:  Review of Systems  Constitutional: Negative.   HENT: Negative.   Eyes: Negative.   Respiratory: Negative.   Cardiovascular: Negative.   Gastrointestinal: Negative.   Genitourinary: Negative.   Musculoskeletal: Negative.   Skin: Negative.    Neurological: Negative.   Endo/Heme/Allergies: Negative.   Psychiatric/Behavioral: Negative.      Objective:   Today's Vitals: BP (!) 156/88 (BP Location: Right Arm, Patient Position: Sitting, Cuff Size: Normal)   Pulse 94   Temp 97.7 F (36.5 C) (Temporal)   Ht 6' (1.829 m)   Wt 219 lb (99.3 kg)   SpO2 97%   BMI 29.70 kg/m  Vitals with BMI 11/13/2019 06/27/2018 06/27/2018  Height 6\' 0"  - -  Weight 219 lbs - -  BMI 61.6 - -  Systolic 073 - 710  Diastolic 88 - 626  Pulse 94 91 -     Physical Exam Vitals and nursing note reviewed.  Constitutional:      Appearance: Normal appearance. He is well-developed, well-groomed and overweight.  HENT:     Head: Normocephalic and atraumatic.     Right Ear: External ear normal.     Left Ear: External ear normal.     Mouth/Throat:     Comments: Mask in place Eyes:     General:        Right eye: No discharge.        Left eye: No discharge.     Conjunctiva/sclera: Conjunctivae normal.  Cardiovascular:     Rate and Rhythm: Normal rate and regular rhythm.     Pulses: Normal pulses.     Heart sounds: Normal heart sounds.  Pulmonary:     Effort: Pulmonary effort is normal.     Breath sounds: Normal breath sounds.  Musculoskeletal:        General: Normal range of motion.     Cervical back: Normal range of motion and neck supple.  Skin:    General: Skin is warm.  Neurological:     General: No focal deficit present.     Mental Status: He is alert and oriented to person, place, and time.  Psychiatric:        Attention and Perception: Attention normal.        Mood and Affect: Mood normal.        Speech: Speech normal.        Behavior: Behavior normal. Behavior is cooperative.        Thought Content: Thought content normal.        Cognition and Memory: Cognition normal.        Judgment: Judgment normal.     Assessment   1. 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 Hyperlipidemia associated with type 2 diabetes mellitus ([place])   3. HTN, goal below 130/80     Tests ordered Orders Placed This Encounter  Procedures  . POCT glycosylated hemoglobin (Hb A1C)     Plan: Please see assessment and plan per problem list above.   Meds ordered this encounter  Medications  .  insulin lispro (HUMALOG) 100 UNIT/ML cartridge    Sig: Inject 0.1-0.3 mLs (10-30 Units total) into the skin 3 (three) times daily with meals. Per sliding scale (averages ~20 units per meal)    Dispense:  15 mL    Refill:  3    Order Specific Question:   Supervising Provider    Answer:   SIMPSON, MARGARET E [[id number]]  . insulin glargine, 2 Unit Dial, (TOUJEO MAX SOLOSTAR) 300 UNIT/ML Solostar Pen    Sig: Inject 40 Units into the skin daily.    Dispense:  15 mL    Refill:  3    Order Specific Question:   Supervising Provider    Answer:   SIMPSON, MARGARET E [[id number]]  . lisinopril (ZESTRIL) 10 MG tablet    Sig: Take 0.5 tablets (5 mg total) by mouth daily.    Dispense:  90 tablet    Refill:  1    Order Specific Question:   Supervising Provider    Answer:   SIMPSON, MARGARET E [[id number]]  . rosuvastatin (CRESTOR) 10 MG tablet    Sig: Take 0.5 tablets (5 mg total) by mouth daily.    Dispense:  90 tablet    Refill:  1    Order Specific Question:   Supervising Provider    Answer:   Jacklynn Bue    Patient to follow-up in 02/05/2020   Note: This dictation was prepared with Dragon dictation along with smaller phrase technology. Similar sounding words can be transcribed inadequately or may not be corrected upon review. Any transcriptional errors that result from this process are unintentional.      Perlie Mayo, NP

## 2019-11-14 ENCOUNTER — Encounter: Payer: Self-pay | Admitting: Family Medicine

## 2019-11-14 DIAGNOSIS — E10319 Type 1 diabetes mellitus with unspecified diabetic retinopathy without macular edema: Secondary | ICD-10-CM | POA: Insufficient documentation

## 2019-11-14 NOTE — Assessment & Plan Note (Signed)
Start low-dose lisinopril. DASH diet that is diabetic friendly encouraged.  Along with exercise.

## 2019-11-14 NOTE — Assessment & Plan Note (Signed)
Start low-dose Crestor.  Heart healthy diet is encouraged.  Avoidance of fast food do strongly encouraged.

## 2019-11-14 NOTE — Assessment & Plan Note (Signed)
Sean Buchanan is encouraged to check blood sugar daily as directed. Continue current medications-. Started on statin as well as ACE inhibitor . Educated on importance of maintain a well balanced diabetic friendly diet.  He is reminded the importance of maintaining  good blood sugars,  taking medications as directed, daily foot care, annual eye exams. Additionally educated about keeping good control over blood pressure and cholesterol as well.

## 2019-11-18 ENCOUNTER — Other Ambulatory Visit: Payer: Self-pay

## 2019-11-18 ENCOUNTER — Telehealth: Payer: Self-pay

## 2019-11-18 DIAGNOSIS — E10319 Type 1 diabetes mellitus with unspecified diabetic retinopathy without macular edema: Secondary | ICD-10-CM

## 2019-11-18 NOTE — Telephone Encounter (Signed)
States he was called in humalog cartridge and it should be humalog 200 unit kwikpen. I wanted to make sure this was correct so I haven't sent it in yet. Please advise

## 2019-11-19 ENCOUNTER — Other Ambulatory Visit: Payer: Self-pay

## 2019-11-19 MED ORDER — INSULIN LISPRO (1 UNIT DIAL) 100 UNIT/ML (KWIKPEN): PEN_INJECTOR | SUBCUTANEOUS | 11 refills | Status: DC

## 2019-11-19 NOTE — Telephone Encounter (Signed)
Changed order to [facility] and sent in. Called pt to make aware and had to leave vm

## 2019-11-19 NOTE — Telephone Encounter (Signed)
I ordered what was listed in chart, please changed to [facility] and order for him. Thank you

## 2019-12-13 ENCOUNTER — Other Ambulatory Visit: Payer: Self-pay

## 2019-12-13 DIAGNOSIS — E10319 Type 1 diabetes mellitus with unspecified diabetic retinopathy without macular edema: Secondary | ICD-10-CM

## 2019-12-13 MED ORDER — UNABLE TO FIND: 3 refills | Status: DC

## 2020-01-15 ENCOUNTER — Ambulatory Visit: Payer: Commercial Managed Care - PPO | Admitting: Podiatry

## 2020-01-29 ENCOUNTER — Ambulatory Visit (INDEPENDENT_AMBULATORY_CARE_PROVIDER_SITE_OTHER): Payer: Commercial Managed Care - PPO

## 2020-01-29 ENCOUNTER — Other Ambulatory Visit: Payer: Self-pay

## 2020-01-29 ENCOUNTER — Ambulatory Visit (INDEPENDENT_AMBULATORY_CARE_PROVIDER_SITE_OTHER): Payer: Commercial Managed Care - PPO | Admitting: Podiatry

## 2020-01-29 ENCOUNTER — Encounter: Payer: Self-pay | Admitting: Podiatry

## 2020-01-29 DIAGNOSIS — M7751 Other enthesopathy of right foot: Secondary | ICD-10-CM

## 2020-01-29 DIAGNOSIS — M79675 Pain in left toe(s): Secondary | ICD-10-CM | POA: Diagnosis not present

## 2020-01-29 DIAGNOSIS — M7731 Calcaneal spur, right foot: Secondary | ICD-10-CM | POA: Diagnosis not present

## 2020-01-29 DIAGNOSIS — S9031XA Contusion of right foot, initial encounter: Secondary | ICD-10-CM

## 2020-01-29 DIAGNOSIS — B351 Tinea unguium: Secondary | ICD-10-CM

## 2020-01-29 DIAGNOSIS — M79674 Pain in right toe(s): Secondary | ICD-10-CM | POA: Diagnosis not present

## 2020-01-29 DIAGNOSIS — M7661 Achilles tendinitis, right leg: Secondary | ICD-10-CM

## 2020-01-29 DIAGNOSIS — E0843 Diabetes mellitus due to underlying condition with diabetic autonomic (poly)neuropathy: Secondary | ICD-10-CM

## 2020-01-29 MED ORDER — MELOXICAM 15 MG PO TABS: 15.0000 mg | ORAL_TABLET | Freq: Every day | ORAL | 1 refills | Status: DC

## 2020-01-29 MED ORDER — METHYLPREDNISOLONE 4 MG PO TBPK: ORAL_TABLET | ORAL | 0 refills | Status: DC

## 2020-01-29 NOTE — Progress Notes (Signed)
HPI: 55 y.o. male presenting today as an established patient for new complaint regarding right posterior heel pain.  Patient states has been very painful for several weeks.  He also states that approximately 1 month ago he noticed redness with pain and sensitivity around the second toe joint.  He denies a history of injury.  He presents for further treatment evaluation Patient also complains of elongated thickened nails bilateral feet.  He is a diabetic.  He is requesting a nail trim today.  They are very sensitive and painful with shoes.  He is unable to trim his own nails.    Past Medical History:  Diagnosis Date  . Adenomatous polyp of descending colon   . Anxiety    chronic stress  . Change in bowel habits 08/09/2016  . CVA (cerebral vascular accident) ([place]) 05/01/2011   Acute CVA 04/2011, with residual left facial weakness, and left hemiparesis   . DISORDER OF BONE AND CARTILAGE UNSPECIFIED 10/14/2009   Qualifier: Diagnosis of  By: Cori Razor LPN, Brandi    . ED (erectile dysfunction)   . 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 Hyperlipidemia   . Hypertension   . IDDM (insulin dependent diabetes mellitus) 1983   AGE 66  . Retinopathy, DIABETIC   . Stroke ([place]) 04/20/2011   left facial weakness, and left hemiparesis      Physical Exam: General: The patient is alert and oriented x3 in no acute distress.  Dermatology: Skin is warm, dry and supple bilateral lower extremities. Negative for open lesions or macerations.  Hyperkeratotic dystrophic discolored elongated nails noted 1-5 bilateral  Vascular: Palpable pedal pulses bilaterally. No edema or erythema noted. Capillary refill within normal limits.  Neurological: Epicritic and protective threshold diminished bilaterally.   Musculoskeletal Exam: Pain on palpation noted to the posterior tubercle of the right calcaneus at the insertion of the Achilles tendon consistent with  retrocalcaneal bursitis. Range of motion within normal limits. Muscle strength 5/5 in all muscle groups bilateral lower extremities. There is also pain on palpation and range of motion to the second MTPJ of the right foot  Radiographic Exam:  Posterior calcaneal spur noted to the respective calcaneus on lateral view. No fracture or dislocation noted. Normal osseous mineralization noted.     Assessment: 1. Insertional Achilles tendinitis right 2.  Posterior heel spur right 3.  Second MTPJ capsulitis right 4.  Pain due to onychomycosis of toenails both 5.  Diabetes mellitus with peripheral polyneuropathy   Plan of Care:  1. Patient was evaluated. Radiographs were reviewed today. 2.  Prescription for Medrol Dosepak 3.  Prescription for meloxicam 15 mg daily to take after completion of the Dosepak 4.  Cam boot dispensed.  Weightbearing as tolerated 5.  Note for work was provided today.  Refrain from work x3 weeks 6.  Mechanical debridement of nails 1-5 bilateral was performed using a nail nipper without incident or bleeding  7.  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 Edrick Kins, DPM    [address], [place] [address]  Office [phone]  Fax [phone]

## 2020-02-03 DIAGNOSIS — M79676 Pain in unspecified toe(s): Secondary | ICD-10-CM

## 2020-02-04 ENCOUNTER — Other Ambulatory Visit: Payer: Self-pay

## 2020-02-04 DIAGNOSIS — E10319 Type 1 diabetes mellitus with unspecified diabetic retinopathy without macular edema: Secondary | ICD-10-CM

## 2020-02-04 MED ORDER — UNABLE TO FIND: 3 refills | Status: DC

## 2020-02-05 ENCOUNTER — Ambulatory Visit (INDEPENDENT_AMBULATORY_CARE_PROVIDER_SITE_OTHER): Payer: Commercial Managed Care - PPO | Admitting: Nurse Practitioner

## 2020-02-05 ENCOUNTER — Encounter: Payer: Self-pay | Admitting: Nurse Practitioner

## 2020-02-05 ENCOUNTER — Other Ambulatory Visit: Payer: Self-pay

## 2020-02-05 VITALS — BP 136/81 | HR 80 | Temp 98.7°F | Resp 20 | Ht 72.0 in | Wt 220.0 lb

## 2020-02-05 DIAGNOSIS — E785 Hyperlipidemia, unspecified: Secondary | ICD-10-CM

## 2020-02-05 DIAGNOSIS — E1069 Type 1 diabetes mellitus with other specified complication: Secondary | ICD-10-CM

## 2020-02-05 DIAGNOSIS — Z0000 Encounter for general adult medical examination without abnormal findings: Secondary | ICD-10-CM

## 2020-02-05 DIAGNOSIS — E10319 Type 1 diabetes mellitus with unspecified diabetic retinopathy without macular edema: Secondary | ICD-10-CM

## 2020-02-05 DIAGNOSIS — Z139 Encounter for screening, unspecified: Secondary | ICD-10-CM

## 2020-02-05 DIAGNOSIS — I10 Essential (primary) hypertension: Secondary | ICD-10-CM

## 2020-02-05 MED ORDER — UNABLE TO FIND: 3 refills | Status: DC

## 2020-02-05 NOTE — Progress Notes (Signed)
Established Patient Office Visit  Subjective:  Patient ID: Sean Buchanan, male    DOB: [birth year]  Age: 55 y.o. MRN: [id number]  CC:  Chief Complaint  Patient presents with  . Annual Exam    HPI Sean Buchanan presents for physical exam. No acute complaints. He recently had COVID and was diagnosed with PCR testing on Jan 19 but has been asymptomatic for a while.    He is a T1DM, and uses a [employer] for CGM.  He takes lisinopril for kidney benefits.  Home SBP has been running in the 130s.  Past Medical History:  Diagnosis Date  . Adenomatous polyp of descending colon   . Anxiety    chronic stress  . Change in bowel habits 08/09/2016  . CVA (cerebral vascular accident) ([place]) 05/01/2011   Acute CVA 04/2011, with residual left facial weakness, and left hemiparesis   . DISORDER OF BONE AND CARTILAGE UNSPECIFIED 10/14/2009   Qualifier: Diagnosis of  By: Cori Razor LPN, Brandi    . ED (erectile dysfunction)   . 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 Hyperlipidemia   . Hypertension   . IDDM (insulin dependent diabetes mellitus) 1983   AGE 60  . Retinopathy, DIABETIC   . Stroke ([place]) 04/20/2011   left facial weakness, and left hemiparesis    Past Surgical History:  Procedure Laterality Date  . COLONOSCOPY N/A 08/30/2016   Procedure: COLONOSCOPY;  Surgeon: Danie Binder, MD;  Location: [facility];  Service: Endoscopy;  Laterality: N/A;  12:00pm  . ESOPHAGOGASTRODUODENOSCOPY N/A 08/30/2016   Procedure: ESOPHAGOGASTRODUODENOSCOPY (EGD);  Surgeon: Danie Binder, MD;  Location: [facility];  Service: Endoscopy;  Laterality: N/A;  . EYE SURGERY  2012   laser surgery to both eyes in July 18 and 26 , 2012  . KNEE ARTHROSCOPY  1991 approx    Family History  Problem Relation Age of Onset  . Diabetes Mother   . Hyperlipidemia Mother   . Hypertension Mother   . Rosacea Mother   . GER disease Father   . Hypertension Father    . Colon polyps Father   . Colon polyps Sister   . Diabetes Sister   . Colon cancer Neg Hx     Social History   Socioeconomic History  . Marital status: Single    Spouse name: Not on file  . Number of children: Not on file  . Years of education: Not on file  . Highest education level: Not on file  Occupational History  . Occupation: EMT   Tobacco Use  . Smoking status: Never Smoker  . Smokeless tobacco: Never Used  Vaping Use  . Vaping Use: Never used  Substance and Sexual Activity  . Alcohol use: No  . Drug use: No  . Sexual activity: Not Currently  Other Topics Concern  . Not on file  Social History Narrative   Lives with mom and dad   Cat 3 outside       Enjoy: tv- hbo max -sci fy      Diet: eats all food groups-- a lot of fast food    Caffeine: soda daily 2 liters or more daily    Water: 2-4 cups       Wears seat belt    Does not use phone while driving   Smoke Camera operator -safe location    Social Determinants  of Health   Financial Resource Strain: Low Risk   . Difficulty of Paying Living Expenses: Not hard at all  Food Insecurity: No Food Insecurity  . Worried About Charity fundraiser in the Last Year: Never true  . Ran Out of Food in the Last Year: Never true  Transportation Needs: No Transportation Needs  . Lack of Transportation (Medical): No  . Lack of Transportation (Non-Medical): No  Physical Activity: Insufficiently Active  . Days of Exercise per Week: 1 day  . Minutes of Exercise per Session: 20 min  Stress: No Stress Concern Present  . Feeling of Stress : Only a little  Social Connections: Socially Isolated  . Frequency of Communication with Friends and Family: Three times a week  . Frequency of Social Gatherings with Friends and Family: Three times a week  . Attends Religious Services: Never  . Active Member of Clubs or Organizations: No  . Attends Archivist Meetings: Never  . Marital Status:  Never married  Intimate Partner Violence: Not At Risk  . Fear of Current or Ex-Partner: No  . Emotionally Abused: No  . Physically Abused: No  . Sexually Abused: No    Outpatient Medications Prior to Visit  Medication Sig Dispense Refill  . aspirin EC 81 MG tablet Take by mouth.    . insulin glargine, 2 Unit Dial, (TOUJEO MAX SOLOSTAR) 300 UNIT/ML Solostar Pen Inject 40 Units into the skin daily. 15 mL 3  . insulin lispro (HUMALOG) 100 UNIT/ML KwikPen Inject 0.1-0.3 mLs (10-30 Units total) into the skin 3 (three) times daily with meals. Per sliding scale (averages ~20 units per meal) 15 mL 11  . lisinopril (ZESTRIL) 10 MG tablet Take 0.5 tablets (5 mg total) by mouth daily. 90 tablet 1  . meloxicam (MOBIC) 15 MG tablet Take 1 tablet (15 mg total) by mouth daily. 30 tablet 1  . rosuvastatin (CRESTOR) 10 MG tablet Take 0.5 tablets (5 mg total) by mouth daily. 90 tablet 1  . methylPREDNISolone (MEDROL DOSEPAK) 4 MG TBPK tablet 6 day dose pack - take as directed 21 tablet 0  . ondansetron (ZOFRAN-ODT) 4 MG disintegrating tablet Take by mouth.    Marland Kitchen UNABLE TO FIND Med Name: Free Style Libre Sensors 1 Package 3   No facility-administered medications prior to visit.    No Known Allergies  ROS Review of Systems  Constitutional: Negative.   HENT: Negative.   Eyes: Negative.   Respiratory: Negative.   Cardiovascular: Negative.   Gastrointestinal: Negative.   Endocrine: Negative.   Genitourinary: Negative.   Musculoskeletal: Negative.        Right foot in orthopedic boot d/t bone spurs and stress fracture per pt  Skin: Negative.   Allergic/Immunologic: Negative.   Neurological: Negative.   Hematological: Negative.   Psychiatric/Behavioral: Negative.       Objective:    Physical Exam Constitutional:      Appearance: Normal appearance.  HENT:     Head: Normocephalic and atraumatic.     Comments: Poor dentition    Right Ear: Tympanic membrane, ear canal and external ear normal.      Left Ear: Tympanic membrane, ear canal and external ear normal.     Nose: Nose normal.     Mouth/Throat:     Mouth: Mucous membranes are moist.     Pharynx: Oropharynx is clear.  Eyes:     Conjunctiva/sclera: Conjunctivae normal.     Pupils: Pupils are equal, round, and reactive to light.  Cardiovascular:     Rate and Rhythm: Normal rate and regular rhythm.     Pulses: Normal pulses.     Heart sounds: Normal heart sounds.  Pulmonary:     Effort: Pulmonary effort is normal.     Breath sounds: Normal breath sounds.  Abdominal:     General: Abdomen is flat. Bowel sounds are normal.  Musculoskeletal:     Cervical back: Normal range of motion and neck supple.     Comments: Right foot in orthopedic boot today which limits ROM  Skin:    General: Skin is warm and dry.     Capillary Refill: Capillary refill takes less than 2 seconds.  Neurological:     General: No focal deficit present.     Mental Status: He is alert and oriented to person, place, and time.     Cranial Nerves: No cranial nerve deficit.     Sensory: No sensory deficit.     Motor: No weakness.     Coordination: Coordination normal.     Gait: Gait normal.     Comments: DM foot exam deferred d/t orthopedic boot placement  Psychiatric:        Mood and Affect: Mood normal.        Behavior: Behavior normal.        Thought Content: Thought content normal.        Judgment: Judgment normal.     BP 136/81   Pulse 80   Temp 98.7 F (37.1 C)   Resp 20   Ht 6' (1.829 m)   Wt 220 lb (99.8 kg)   SpO2 97%   BMI 29.84 kg/m  Wt Readings from Last 3 Encounters:  02/05/20 220 lb (99.8 kg)  11/13/19 219 lb (99.3 kg)  06/27/18 220 lb (99.8 kg)     Health Maintenance Due  Topic Date Due  . Hepatitis C Screening  Never done  . FOOT EXAM  12/09/2010    There are no preventive care reminders to display for this patient.  Lab Results  Component Value Date   TSH 2.419 10/15/2009   Lab Results  Component Value  Date   WBC 6.0 06/27/2018   HGB 15.1 06/27/2018   HCT 47.5 06/27/2018   MCV 86.4 06/27/2018   PLT 285 06/27/2018   Lab Results  Component Value Date   NA 135 06/27/2018   K 4.0 06/27/2018   CO2 24 06/27/2018   GLUCOSE 318 (H) 06/27/2018   BUN 16 06/27/2018   CREATININE 0.95 06/27/2018   BILITOT 0.5 06/27/2018   ALKPHOS 150 (H) 06/27/2018   AST 22 06/27/2018   ALT 18 06/27/2018   PROT 7.6 06/27/2018   ALBUMIN 3.9 06/27/2018   CALCIUM 8.8 (L) 06/27/2018   ANIONGAP 10 06/27/2018   No results found for: CHOL No results found for: HDL No results found for: LDLCALC No results found for: TRIG No results found for: CHOLHDL Lab Results  Component Value Date   HGBA1C 8.1 (A) 11/13/2019      Assessment & Plan:   Problem List Items Addressed This Visit      Cardiovascular and Mediastinum   HTN, goal below 130/80    -BP at goal today -Continue lisinopril        Endocrine   Hyperlipidemia due to type 1 diabetes mellitus ([place])    -checking labs today -Continue rosuvastatin      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 ([place])    -seen by ophthalmology  within the year -on ACEi and statin -will check A1c and microalbumin today -refilled Freestyle Libre sensors; he was only getting 1 at a time, and he needed a 2-pack to cover him for 30 days      Relevant Medications   UNABLE TO FIND   Other Relevant Orders   CBC with Differential/Platelet   CMP14+EGFR   Hemoglobin A1c   Lipid Panel With LDL/HDL Ratio   Microalbumin / creatinine urine ratio    Other Visit Diagnoses    Preventative health care    -  Primary   Relevant Orders   CBC with Differential/Platelet   CMP14+EGFR   Hemoglobin A1c   HCV Ab w/Rflx to Verification   Lipid Panel With LDL/HDL Ratio   Microalbumin / creatinine urine ratio   Screening due       Relevant Orders   HCV Ab w/Rflx to Verification      Meds ordered this encounter  Medications  . UNABLE TO FIND    Sig: Med Name:  [employer] -use each for 14 days to monitor blood sugar continuously    Dispense:  2 each    Refill:  3    E10.319; 30-day supply    Follow-up: No follow-ups on file.    Noreene Larsson, NP

## 2020-02-05 NOTE — Patient Instructions (Signed)
We are drawing fasting labs today, and we will notify you of the results.  We will meet back up in 3 months for a lab follow-up.

## 2020-02-05 NOTE — Assessment & Plan Note (Signed)
-  BP at goal today -Continue lisinopril

## 2020-02-05 NOTE — Assessment & Plan Note (Signed)
-  seen by ophthalmology within the year -on ACEi and statin -will check A1c and microalbumin today -refilled Freestyle Libre sensors; he was only getting 1 at a time, and he needed a 2-pack to cover him for 30 days

## 2020-02-05 NOTE — Assessment & Plan Note (Signed)
-  checking labs today -Continue rosuvastatin

## 2020-02-06 ENCOUNTER — Other Ambulatory Visit: Payer: Self-pay | Admitting: Nurse Practitioner

## 2020-02-06 DIAGNOSIS — E10319 Type 1 diabetes mellitus with unspecified diabetic retinopathy without macular edema: Secondary | ICD-10-CM

## 2020-02-06 MED ORDER — TOUJEO MAX SOLOSTAR 300 UNIT/ML ~~LOC~~ SOPN: 46.0000 [IU] | PEN_INJECTOR | Freq: Every day | SUBCUTANEOUS | 1 refills | Status: DC

## 2020-02-06 NOTE — Progress Notes (Signed)
His A1c is 8.3 which is on the high side, so lets increase his Toujeo to 45 units per day.  We would like his A1c under 7. Additionally, his alkaline phosphatase is 168, up from 150.  If this increases again with his next set of labs, we will consider a GI consult.

## 2020-02-06 NOTE — Progress Notes (Signed)
Excuse me, the pen goes up in 2 point increments, so increase his Toujeo to 46 units per day.

## 2020-02-07 LAB — CMP14+EGFR
ALT: 20 IU/L (ref 0–44)
AST: 15 IU/L (ref 0–40)
Albumin/Globulin Ratio: 1.5 (ref 1.2–2.2)
Albumin: 4.5 g/dL (ref 3.8–4.9)
Alkaline Phosphatase: 168 IU/L — ABNORMAL HIGH (ref 44–121)
BUN/Creatinine Ratio: 21 — ABNORMAL HIGH (ref 9–20)
BUN: 17 mg/dL (ref 6–24)
Bilirubin Total: 0.7 mg/dL (ref 0.0–1.2)
CO2: 24 mmol/L (ref 20–29)
Calcium: 9.6 mg/dL (ref 8.7–10.2)
Chloride: 102 mmol/L (ref 96–106)
Creatinine, Ser: 0.8 mg/dL (ref 0.76–1.27)
GFR calc Af Amer: 119 mL/min/{1.73_m2} (ref >59)
GFR calc non Af Amer: 103 mL/min/{1.73_m2} (ref >59)
Globulin, Total: 3.1 g/dL (ref 1.5–4.5)
Glucose: 134 mg/dL — ABNORMAL HIGH (ref 65–99)
Potassium: 4.4 mmol/L (ref 3.5–5.2)
Sodium: 142 mmol/L (ref 134–144)
Total Protein: 7.6 g/dL (ref 6.0–8.5)

## 2020-02-07 LAB — MICROALBUMIN / CREATININE URINE RATIO
Creatinine, Urine: 121.5 mg/dL
Microalb/Creat Ratio: 9 mg/g creat (ref 0–29)
Microalbumin, Urine: 11.3 ug/mL

## 2020-02-07 LAB — CBC WITH DIFFERENTIAL/PLATELET
Basophils Absolute: 0.1 10*3/uL (ref 0.0–0.2)
Basos: 1 %
EOS (ABSOLUTE): 0.1 10*3/uL (ref 0.0–0.4)
Eos: 2 %
Hematocrit: 47.5 % (ref 37.5–51.0)
Hemoglobin: 15.8 g/dL (ref 13.0–17.7)
Immature Grans (Abs): 0 10*3/uL (ref 0.0–0.1)
Immature Granulocytes: 0 %
Lymphocytes Absolute: 2.6 10*3/uL (ref 0.7–3.1)
Lymphs: 32 %
MCH: 27.6 pg (ref 26.6–33.0)
MCHC: 33.3 g/dL (ref 31.5–35.7)
MCV: 83 fL (ref 79–97)
Monocytes Absolute: 0.7 10*3/uL (ref 0.1–0.9)
Monocytes: 8 %
Neutrophils Absolute: 4.7 10*3/uL (ref 1.4–7.0)
Neutrophils: 57 %
Platelets: 317 10*3/uL (ref 150–450)
RBC: 5.73 x10E6/uL (ref 4.14–5.80)
RDW: 13.5 % (ref 11.6–15.4)
WBC: 8.1 10*3/uL (ref 3.4–10.8)

## 2020-02-07 LAB — HEMOGLOBIN A1C
Est. average glucose Bld gHb Est-mCnc: 192 mg/dL
Hgb A1c MFr Bld: 8.3 % — ABNORMAL HIGH (ref 4.8–5.6)

## 2020-02-07 LAB — LIPID PANEL WITH LDL/HDL RATIO
Cholesterol, Total: 231 mg/dL — ABNORMAL HIGH (ref 100–199)
HDL: 71 mg/dL (ref >39)
LDL Chol Calc (NIH): 140 mg/dL — ABNORMAL HIGH (ref 0–99)
LDL/HDL Ratio: 2 ratio (ref 0.0–3.6)
Triglycerides: 117 mg/dL (ref 0–149)
VLDL Cholesterol Cal: 20 mg/dL (ref 5–40)

## 2020-02-07 LAB — HCV AB W/RFLX TO VERIFICATION: HCV Ab: 0.1 s/co ratio (ref 0.0–0.9)

## 2020-02-07 LAB — HCV INTERPRETATION

## 2020-02-24 ENCOUNTER — Encounter: Payer: Self-pay | Admitting: Podiatry

## 2020-02-24 ENCOUNTER — Other Ambulatory Visit: Payer: Self-pay

## 2020-02-24 ENCOUNTER — Ambulatory Visit (INDEPENDENT_AMBULATORY_CARE_PROVIDER_SITE_OTHER): Payer: Commercial Managed Care - PPO | Admitting: Podiatry

## 2020-02-24 DIAGNOSIS — M7661 Achilles tendinitis, right leg: Secondary | ICD-10-CM | POA: Diagnosis not present

## 2020-02-24 DIAGNOSIS — M7751 Other enthesopathy of right foot: Secondary | ICD-10-CM | POA: Diagnosis not present

## 2020-02-24 NOTE — Progress Notes (Signed)
   HPI: 55 y.o. male presenting today for follow-up evaluation of right lower extremity pain.  Patient states that he has been wearing a cam boot for the last 3 weeks and it has helped significantly.  He also completed the Medrol Dosepak and is currently taking the meloxicam.  He also purchased a new pair shoes which she states helped significantly.  No new complaints at this time  Past Medical History:  Diagnosis Date  . Adenomatous polyp of descending colon   . Anxiety    chronic stress  . Change in bowel habits 08/09/2016  . CVA (cerebral vascular accident) ([place]) 05/01/2011   Acute CVA 04/2011, with residual left facial weakness, and left hemiparesis   . DISORDER OF BONE AND CARTILAGE UNSPECIFIED 10/14/2009   Qualifier: Diagnosis of  By: Cori Razor LPN, Brandi    . ED (erectile dysfunction)   . 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 Hyperlipidemia   . Hypertension   . IDDM (insulin dependent diabetes mellitus) 1983   AGE 72  . Retinopathy, DIABETIC   . Stroke ([place]) 04/20/2011   left facial weakness, and left hemiparesis      Physical Exam: General: The patient is alert and oriented x3 in no acute distress.  Dermatology: Skin is warm, dry and supple bilateral lower extremities. Negative for open lesions or macerations.  Hyperkeratotic dystrophic discolored elongated nails noted 1-5 bilateral  Vascular: Palpable pedal pulses bilaterally. No edema or erythema noted. Capillary refill within normal limits.  Neurological: Epicritic and protective threshold diminished bilaterally.   Musculoskeletal Exam: Negative for any significant pain on palpation noted to the posterior tubercle of the right calcaneus at the insertion of the Achilles tendon consistent with retrocalcaneal bursitis. Range of motion within normal limits. Muscle strength 5/5 in all muscle groups bilateral lower extremities. There is also pain on palpation and range of  motion to the second MTPJ of the right foot  Assessment: 1. Insertional Achilles tendinitis right 2.  Posterior heel spur right 3.  Second MTPJ capsulitis right 4.  Pain due to onychomycosis of toenails both 5.  Diabetes mellitus with peripheral polyneuropathy   Plan of Care:  1. Patient was evaluated.  2.  Overall the patient is doing significantly better.  Recommend good supportive shoes.  He may discontinue the cam boot 3.  Continue meloxicam as needed 4.  Patient may return to work 03/04/2020 full activity no restrictions 5.  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 Edrick Kins, DPM    [address], [place] [address]                Office [phone]  Fax [phone]

## 2020-03-02 IMAGING — CT CT HEAD WITHOUT CONTRAST
3 series · 16 of 47 positions shown, 19 images · non-contrast
Comparison: CT brain 09/11/2015

CLINICAL DATA: Vision loss right eye

EXAM:
CT HEAD WITHOUT CONTRAST
TECHNIQUE: Contiguous axial images were obtained from the base of the skull
through the vertex without intravenous contrast.

[Series 2: head w o · axial · 0.45mm/px · z∈[+1357,+1497]mm · 10 of 34 slices shown, 13 images]
[im 3/34  brain]
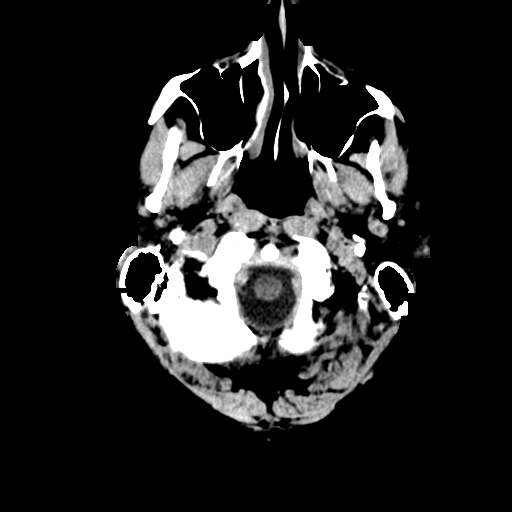
[im 3/34  bone]
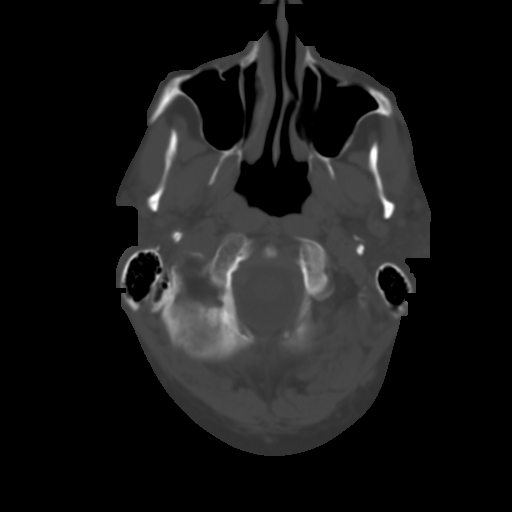
[im 6/34  brain]
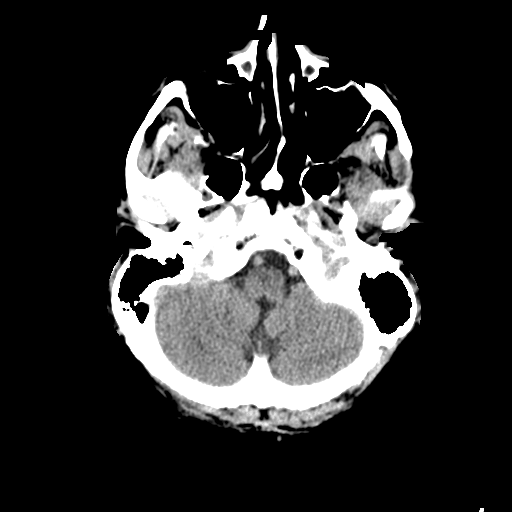
[im 10/34  brain]
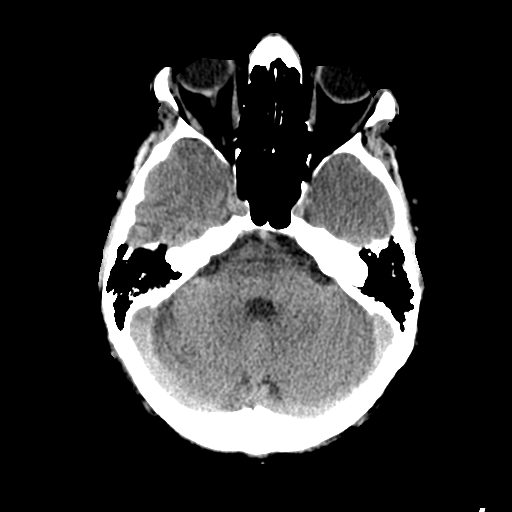
[im 12/34  brain]
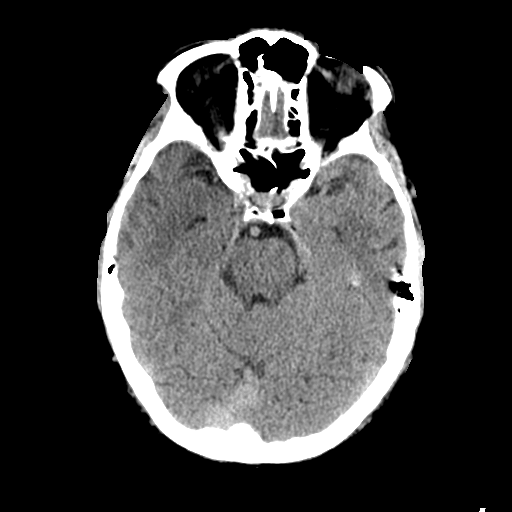
[im 15/34  brain]
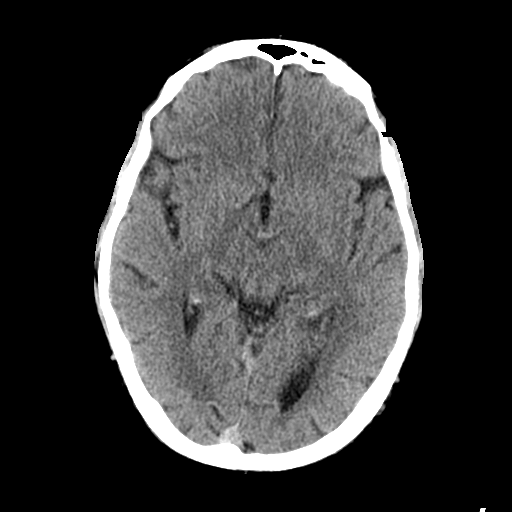
[im 15/34  bone]
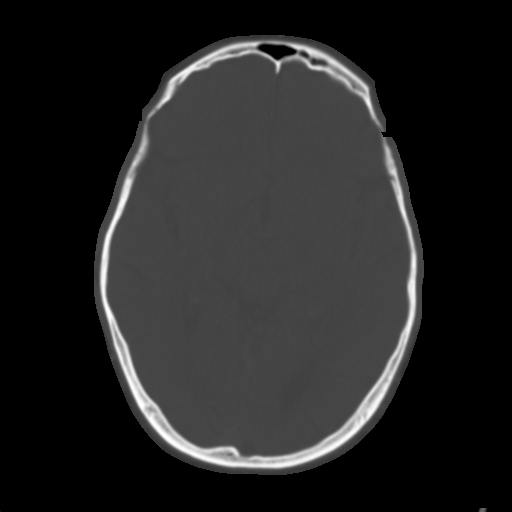
[im 19/34  brain]
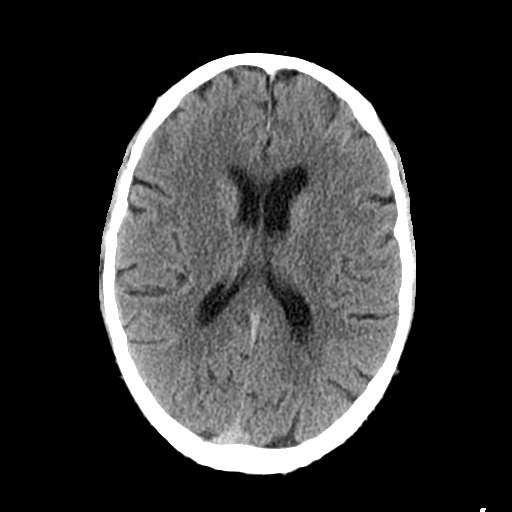
[im 22/34  brain]
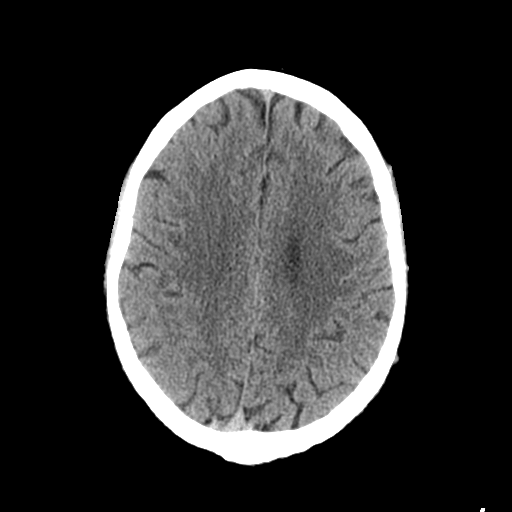
[im 26/34  brain]
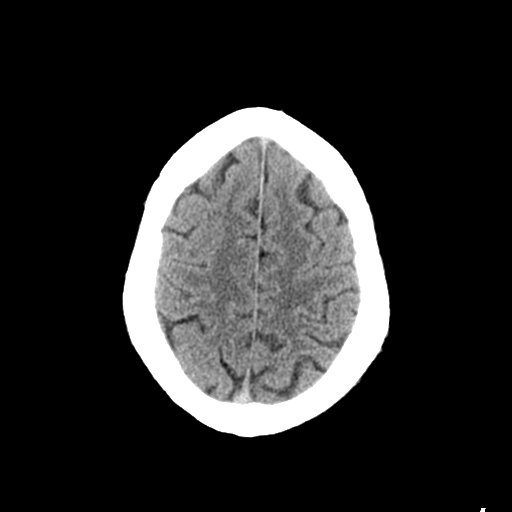
[im 28/34  brain]
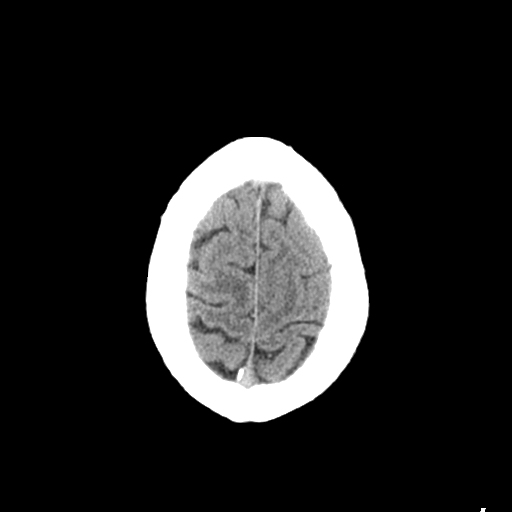
[im 28/34  bone]
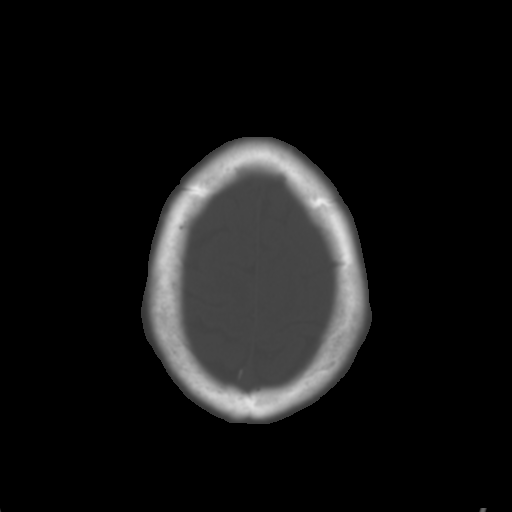
[im 31/34  brain]
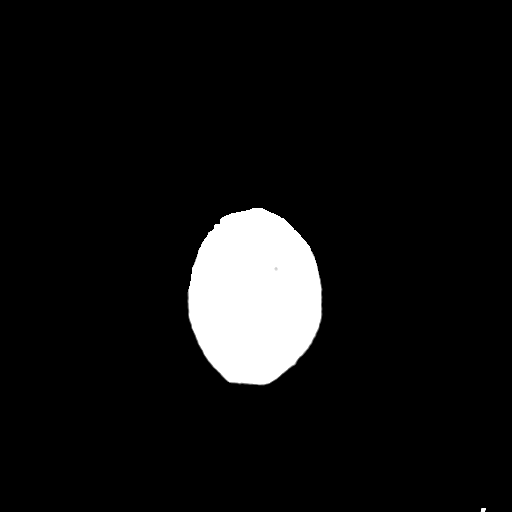

[Series 4: coronal soft · coronal · 0.33mm/px · 3 of 74 slices shown]
[im 25/74  brain]
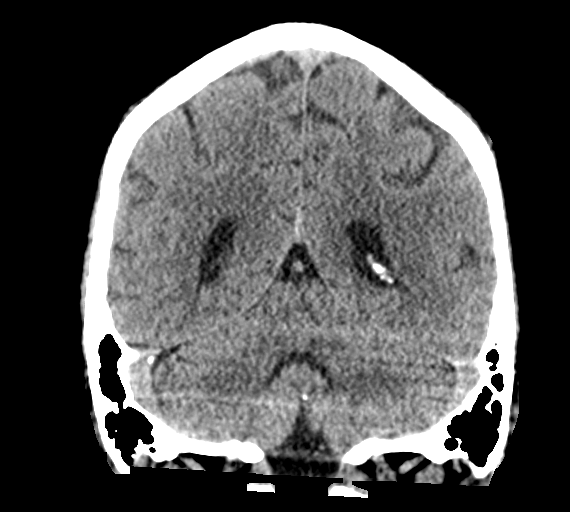
[im 33/74  brain]
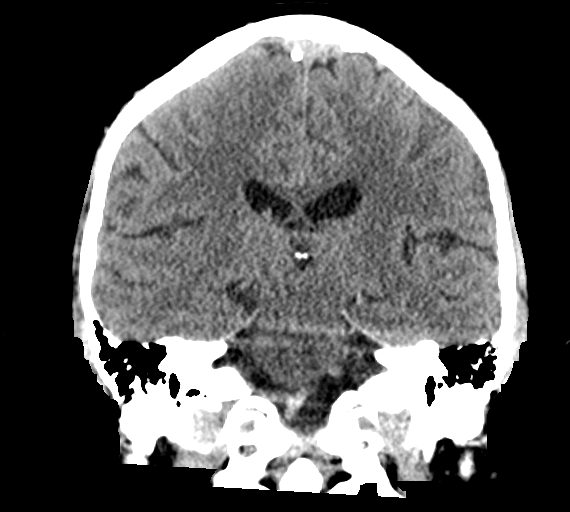
[im 41/74  brain]
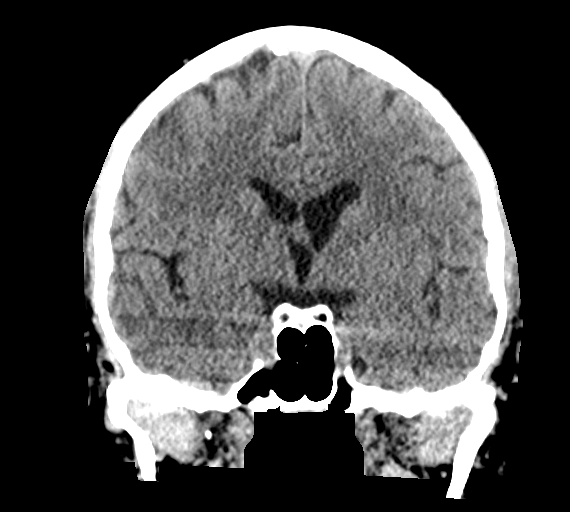

[Series 5: sagittal soft · sagittal · 0.34mm/px · 3 of 63 slices shown]
[im 21/63  brain]
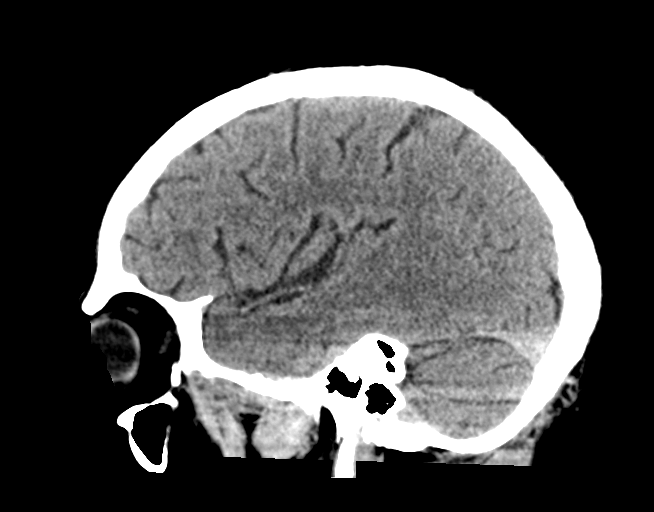
[im 32/63  brain]
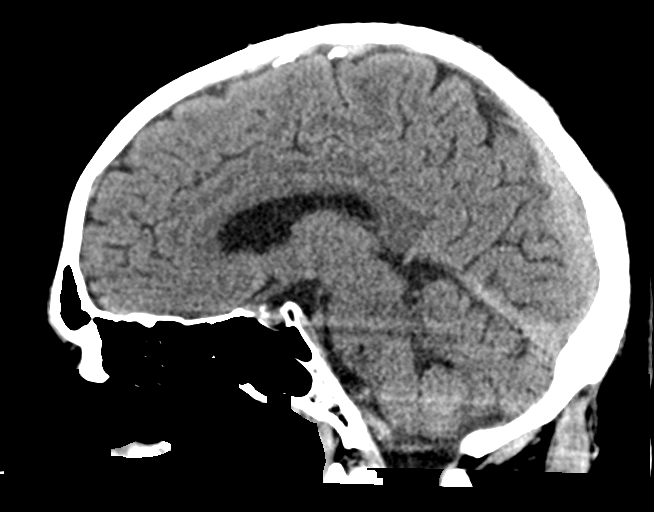
[im 42/63  brain]
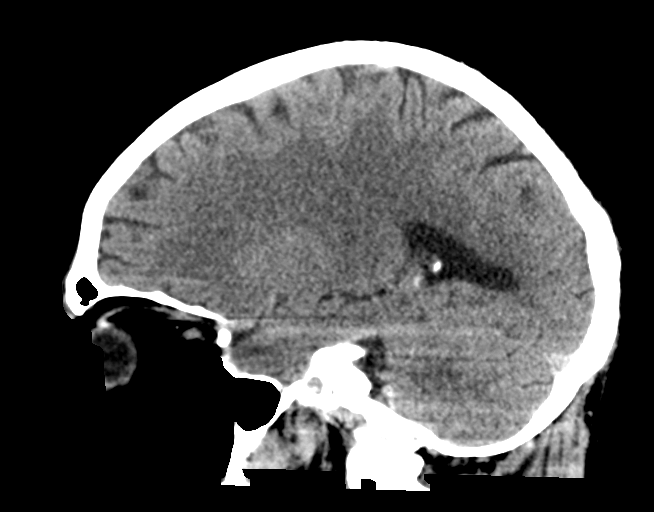

[16 of 47 positions shown; findings below may reference images not displayed]

FINDINGS: Brain: No acute territorial infarction, hemorrhage or intracranial
mass. Normal ventricle size.

Vascular: No hyperdense vessel or unexpected calcification.
Scattered calcifications at the carotid siphons.

Skull: Normal. Negative for fracture or focal lesion.

Sinuses/Orbits: Mild mucosal thickening in the maxillary sinuses.

Other: None
IMPRESSION: Negative non contrasted CT appearance of the brain.

## 2020-05-06 ENCOUNTER — Ambulatory Visit: Payer: Commercial Managed Care - PPO | Admitting: Nurse Practitioner

## 2020-06-24 ENCOUNTER — Ambulatory Visit (INDEPENDENT_AMBULATORY_CARE_PROVIDER_SITE_OTHER): Payer: Managed Care, Other (non HMO) | Admitting: Nurse Practitioner

## 2020-06-24 ENCOUNTER — Other Ambulatory Visit: Payer: Self-pay

## 2020-06-24 ENCOUNTER — Encounter: Payer: Self-pay | Admitting: Nurse Practitioner

## 2020-06-24 VITALS — BP 149/82 | HR 77 | Temp 97.5°F | Ht 72.0 in | Wt 212.0 lb

## 2020-06-24 DIAGNOSIS — E1069 Type 1 diabetes mellitus with other specified complication: Secondary | ICD-10-CM

## 2020-06-24 DIAGNOSIS — E785 Hyperlipidemia, unspecified: Secondary | ICD-10-CM

## 2020-06-24 DIAGNOSIS — I10 Essential (primary) hypertension: Secondary | ICD-10-CM

## 2020-06-24 DIAGNOSIS — E10319 Type 1 diabetes mellitus with unspecified diabetic retinopathy without macular edema: Secondary | ICD-10-CM | POA: Diagnosis not present

## 2020-06-24 MED ORDER — TRESIBA FLEXTOUCH 100 UNIT/ML ~~LOC~~ SOPN: 46.0000 [IU] | PEN_INJECTOR | Freq: Every day | SUBCUTANEOUS | 1 refills | Status: DC

## 2020-06-24 NOTE — Patient Instructions (Signed)
Please have labs drawn today

## 2020-06-24 NOTE — Progress Notes (Signed)
Established Patient Office Visit  Subjective:  Patient ID: Sean Buchanan, male    DOB: January 24, 1965  Age: 55 y.o. MRN: [id number]  CC:  Chief Complaint  Patient presents with   Diabetes    Follow up    HPI Sean Buchanan presents for lab follow-up.  He switched jobs in April, and he is happy with this. He works with the [place] some, and he is working at the hospital now.  He states his blood sugars have been running well at home. He is moving more with work at the hospital, so his blood sugars have been better.  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   Qualifier: Diagnosis of  By: Rebecca Eaton LPN, Jaime     Hyperlipidemia    Hypertension    IDDM (insulin dependent diabetes mellitus) 1983   AGE 79   Retinopathy, DIABETIC    Stroke ([place]) 04/20/2011   left facial weakness, and left hemiparesis    Past Surgical History:  Procedure Laterality Date   COLONOSCOPY N/A 08/30/2016   Procedure: COLONOSCOPY;  Surgeon: Danie Binder, MD;  [facility]: [facility];  Service: Endoscopy;  Laterality: N/A;  12:00pm   ESOPHAGOGASTRODUODENOSCOPY N/A 08/30/2016   Procedure: ESOPHAGOGASTRODUODENOSCOPY (EGD);  Surgeon: Danie Binder, MD;  Location: [facility];  Service: Endoscopy;  Laterality: N/A;   EYE SURGERY  2012   laser surgery to both eyes in July 18 and 26 , 2012   KNEE ARTHROSCOPY  1991 approx    Family History  Problem Relation Age of Onset   Diabetes Mother    Hyperlipidemia Mother    Hypertension Mother    Rosacea Mother    GER disease Father    Hypertension Father     Colon polyps Father    Colon polyps Sister    Diabetes Sister    Colon cancer Neg Hx     Social History   Socioeconomic History   Marital status: Single    Spouse name: Not on file   Number of children: Not on file   Years of education: Not on file   Highest education level: Not on file  Occupational History   Occupation: EMT   Tobacco Use   Smoking status: Never   Smokeless tobacco: Never  Vaping Use   Vaping Use: Never used  Substance and Sexual Activity   Alcohol use: No   Drug use: No   Sexual activity: Not Currently  Other Topics Concern   Not on file  Social History Narrative   Lives with mom and dad   Cat 3 outside       Enjoy: tv- hbo max -sci fy      Diet: eats all food groups-- a lot of fast food    Caffeine: soda daily 2 liters or more daily    Water: 2-4 cups       Wears seat belt    Does not use phone while driving   Sports administrator -safe location  Social Determinants of Health   Financial Resource Strain: Low Risk    Difficulty of Paying Living Expenses: Not hard at all  Food Insecurity: No Food Insecurity   Worried About Charity fundraiser in the Last Year: Never true   [place] in the Last Year: Never true  Transportation Needs: No Transportation Needs   Lack of Transportation (Medical): No   Lack of Transportation (Non-Medical): No  Physical Activity: Insufficiently Active   Days of Exercise per Week: 1 day   Minutes of Exercise per Session: 20 min  Stress: No Stress Concern Present   Feeling of Stress : Only a little  Social Connections: Socially Isolated   Frequency of Communication with Friends and Family: Three times a week   Frequency of Social Gatherings with Friends and Family: Three times a week   Attends Religious Services: Never   Active Member of Clubs or Organizations: No   Attends Archivist Meetings: Never   Marital Status: Never married  Human resources officer Violence: Not At  Risk   Fear of Current or Ex-Partner: No   Emotionally Abused: No   Physically Abused: No   Sexually Abused: No    Outpatient Medications Prior to Visit  Medication Sig Dispense Refill   Continuous Blood Gluc Sensor (FREESTYLE LIBRE 14 DAY SENSOR) MISC SMARTSIG:1 Topical Every 2 Weeks     insulin lispro (HUMALOG) 100 UNIT/ML KwikPen Inject 0.1-0.3 mLs (10-30 Units total) into the skin 3 (three) times daily with meals. Per sliding scale (averages ~20 units per meal) 15 mL 11   lisinopril (ZESTRIL) 10 MG tablet Take 0.5 tablets (5 mg total) by mouth daily. 90 tablet 1   UNABLE TO FIND Med Name: [employer] -use each for 14 days to monitor blood sugar continuously 2 each 3   aspirin EC 81 MG tablet Take by mouth. (Patient not taking: Reported on 06/24/2020)     insulin glargine, 2 Unit Dial, (TOUJEO MAX SOLOSTAR) 300 UNIT/ML Solostar Pen Inject 46 Units into the skin daily. (Patient not taking: Reported on 06/24/2020) 15 mL 1   meloxicam (MOBIC) 15 MG tablet Take 1 tablet (15 mg total) by mouth daily. (Patient not taking: Reported on 06/24/2020) 30 tablet 1   rosuvastatin (CRESTOR) 10 MG tablet Take 0.5 tablets (5 mg total) by mouth daily. (Patient not taking: Reported on 06/24/2020) 90 tablet 1   No facility-administered medications prior to visit.    No Known Allergies  ROS Review of Systems  Constitutional: Negative.   Respiratory: Negative.    Cardiovascular: Negative.   Endocrine: Negative.   Psychiatric/Behavioral: Negative.       Objective:    Physical Exam Constitutional:      Appearance: Normal appearance.  Cardiovascular:     Rate and Rhythm: Normal rate and regular rhythm.     Pulses: Normal pulses.          Dorsalis pedis pulses are 2+ on the right side and 2+ on the left side.     Heart sounds: Normal heart sounds.  Pulmonary:     Effort: Pulmonary effort is normal.     Breath sounds: Normal breath sounds.  Feet:     Right foot:     Protective  Sensation: 10 sites tested.  10 sites sensed.     Skin integrity: Dry skin present.     Toenail Condition: Right toenails are abnormally thick.     Left foot:     Protective Sensation:  10 sites tested.  8 sites sensed.     Skin integrity: Dry skin present.     Toenail Condition: Left toenails are abnormally thick.  Neurological:     Mental Status: He is alert.  Psychiatric:        Mood and Affect: Mood normal.        Behavior: Behavior normal.        Thought Content: Thought content normal.        Judgment: Judgment normal.    BP (!) 149/82 (BP Location: Right Arm, Patient Position: Sitting, Cuff Size: Normal)   Pulse 77   Temp (!) 97.5 F (36.4 C) (Temporal)   Ht 6' (1.829 m)   Wt 212 lb (96.2 kg)   SpO2 97%   BMI 28.75 kg/m  Wt Readings from Last 3 Encounters:  06/24/20 212 lb (96.2 kg)  02/05/20 220 lb (99.8 kg)  11/13/19 219 lb (99.3 kg)     Health Maintenance Due  Topic Date Due   Zoster Vaccines- Shingrix (1 of 2) Never done   COVID-19 Vaccine (4 - Booster for Moderna series) 05/08/2020   OPHTHALMOLOGY EXAM  06/03/2020    There are no preventive care reminders to display for this patient.  Lab Results  Component Value Date   TSH 2.419 10/15/2009   Lab Results  Component Value Date   WBC 8.1 02/05/2020   HGB 15.8 02/05/2020   HCT 47.5 02/05/2020   MCV 83 02/05/2020   PLT 317 02/05/2020   Lab Results  Component Value Date   NA 142 02/05/2020   K 4.4 02/05/2020   CO2 24 02/05/2020   GLUCOSE 134 (H) 02/05/2020   BUN 17 02/05/2020   CREATININE 0.80 02/05/2020   BILITOT 0.7 02/05/2020   ALKPHOS 168 (H) 02/05/2020   AST 15 02/05/2020   ALT 20 02/05/2020   PROT 7.6 02/05/2020   ALBUMIN 4.5 02/05/2020   CALCIUM 9.6 02/05/2020   ANIONGAP 10 06/27/2018   Lab Results  Component Value Date   CHOL 231 (H) 02/05/2020   Lab Results  Component Value Date   HDL 71 02/05/2020   Lab Results  Component Value Date   LDLCALC 140 (H) 02/05/2020   Lab  Results  Component Value Date   TRIG 117 02/05/2020   No results found for: CHOLHDL Lab Results  Component Value Date   HGBA1C 8.3 (H) 02/05/2020      Assessment & Plan:   Problem List Items Addressed This Visit       Cardiovascular and Mediastinum   HTN, goal below 130/80    BP Readings from Last 3 Encounters:  06/24/20 (!) 149/82  02/05/20 136/81  11/13/19 (!) 156/88  -BP elevated today -will consider increasing lisinopril if elevated at next OV       Relevant Orders   CBC with Differential/Platelet   CMP14+EGFR     Endocrine   Hyperlipidemia due to type 1 diabetes mellitus ([place])    -checking lipids today -was on rosuvastatin previously, but he is no longer taking that        Relevant Medications   insulin degludec (TRESIBA FLEXTOUCH) 100 UNIT/ML FlexTouch Pen   Other Relevant Orders   Lipid Panel With LDL/HDL Ratio   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    Dispense:  45 mL    Refill:  1    90-day supply for E10.319    Follow-up: Return in about 3 months (around 09/24/2020) for Lab follow-up (same-day fasting labs).    Noreene Larsson, NP

## 2020-06-24 NOTE — Assessment & Plan Note (Addendum)
-  checking A1c today -foot exam completed

## 2020-06-24 NOTE — Assessment & Plan Note (Signed)
-  checking lipids today -was on rosuvastatin previously, but he is no longer taking that

## 2020-06-24 NOTE — Assessment & Plan Note (Signed)
BP Readings from Last 3 Encounters:  06/24/20 (!) 149/82  02/05/20 136/81  11/13/19 (!) 156/88   -BP elevated today -will consider increasing lisinopril if elevated at next OV

## 2020-06-25 NOTE — Progress Notes (Signed)
Labs are good. Alkaline phosphatase is down from previous labs and A1c is 8.

## 2020-06-26 LAB — CMP14+EGFR
ALT: 17 IU/L (ref 0–44)
AST: 17 IU/L (ref 0–40)
Albumin/Globulin Ratio: 1.7 (ref 1.2–2.2)
Albumin: 4.3 g/dL (ref 3.8–4.9)
Alkaline Phosphatase: 134 IU/L — ABNORMAL HIGH (ref 44–121)
BUN/Creatinine Ratio: 12 (ref 9–20)
BUN: 9 mg/dL (ref 6–24)
Bilirubin Total: 0.5 mg/dL (ref 0.0–1.2)
CO2: 23 mmol/L (ref 20–29)
Calcium: 9.6 mg/dL (ref 8.7–10.2)
Chloride: 101 mmol/L (ref 96–106)
Creatinine, Ser: 0.75 mg/dL — ABNORMAL LOW (ref 0.76–1.27)
Globulin, Total: 2.6 g/dL (ref 1.5–4.5)
Glucose: 79 mg/dL (ref 65–99)
Potassium: 4.2 mmol/L (ref 3.5–5.2)
Sodium: 140 mmol/L (ref 134–144)
Total Protein: 6.9 g/dL (ref 6.0–8.5)
eGFR: 109 mL/min/{1.73_m2} (ref >59)

## 2020-06-26 LAB — CBC WITH DIFFERENTIAL/PLATELET
Basophils Absolute: 0 10*3/uL (ref 0.0–0.2)
Basos: 1 %
EOS (ABSOLUTE): 0.1 10*3/uL (ref 0.0–0.4)
Eos: 2 %
Hematocrit: 46.4 % (ref 37.5–51.0)
Hemoglobin: 15.4 g/dL (ref 13.0–17.7)
Immature Grans (Abs): 0 10*3/uL (ref 0.0–0.1)
Immature Granulocytes: 0 %
Lymphocytes Absolute: 1.9 10*3/uL (ref 0.7–3.1)
Lymphs: 31 %
MCH: 27.1 pg (ref 26.6–33.0)
MCHC: 33.2 g/dL (ref 31.5–35.7)
MCV: 82 fL (ref 79–97)
Monocytes Absolute: 0.5 10*3/uL (ref 0.1–0.9)
Monocytes: 8 %
Neutrophils Absolute: 3.5 10*3/uL (ref 1.4–7.0)
Neutrophils: 58 %
Platelets: 302 10*3/uL (ref 150–450)
RBC: 5.69 x10E6/uL (ref 4.14–5.80)
RDW: 13 % (ref 11.6–15.4)
WBC: 6 10*3/uL (ref 3.4–10.8)

## 2020-06-26 LAB — HEMOGLOBIN A1C
Est. average glucose Bld gHb Est-mCnc: 183 mg/dL
Hgb A1c MFr Bld: 8 % — ABNORMAL HIGH (ref 4.8–5.6)

## 2020-06-26 LAB — LIPID PANEL WITH LDL/HDL RATIO
Cholesterol, Total: 165 mg/dL (ref 100–199)
HDL: 62 mg/dL (ref >39)
LDL Chol Calc (NIH): 91 mg/dL (ref 0–99)
LDL/HDL Ratio: 1.5 ratio (ref 0.0–3.6)
Triglycerides: 60 mg/dL (ref 0–149)
VLDL Cholesterol Cal: 12 mg/dL (ref 5–40)

## 2020-06-26 LAB — MICROALBUMIN / CREATININE URINE RATIO
Creatinine, Urine: 122.9 mg/dL
Microalb/Creat Ratio: 3 mg/g creat (ref 0–29)
Microalbumin, Urine: 3.9 ug/mL

## 2020-09-04 ENCOUNTER — Other Ambulatory Visit: Payer: Self-pay

## 2020-09-04 ENCOUNTER — Telehealth: Payer: Self-pay

## 2020-09-04 DIAGNOSIS — E10319 Type 1 diabetes mellitus with unspecified diabetic retinopathy without macular edema: Secondary | ICD-10-CM

## 2020-09-04 MED ORDER — LISINOPRIL 10 MG PO TABS: 5.0000 mg | ORAL_TABLET | Freq: Every day | ORAL | 1 refills | Status: DC

## 2020-09-04 MED ORDER — FREESTYLE LIBRE 14 DAY SENSOR MISC: 1 refills | Status: DC

## 2020-09-04 NOTE — Telephone Encounter (Signed)
Rxs sent

## 2020-09-04 NOTE — Telephone Encounter (Signed)
Patient called need refill asking if possible to do a 3 month supply on the freestyle libre.   Continuous Blood Gluc Sensor (FREESTYLE LIBRE 14 DAY SENSOR) MISC    lisinopril (ZESTRIL) 10 MG tablet   Sean Buchanan

## 2020-09-24 ENCOUNTER — Ambulatory Visit: Payer: Managed Care, Other (non HMO) | Admitting: Nurse Practitioner

## 2021-01-26 ENCOUNTER — Other Ambulatory Visit: Payer: Self-pay | Admitting: *Deleted

## 2021-01-26 MED ORDER — INSULIN LISPRO (1 UNIT DIAL) 100 UNIT/ML (KWIKPEN): PEN_INJECTOR | SUBCUTANEOUS | 11 refills | Status: DC

## 2021-07-14 ENCOUNTER — Telehealth: Payer: Self-pay

## 2021-07-14 NOTE — Telephone Encounter (Signed)
Patient appt scheduled and patient is aware

## 2021-07-14 NOTE — Telephone Encounter (Signed)
Called pt to schedule follow up appt per [facility] no answer left vm

## 2021-08-03 ENCOUNTER — Encounter: Payer: Self-pay | Admitting: Nurse Practitioner

## 2021-08-03 ENCOUNTER — Other Ambulatory Visit: Payer: Self-pay

## 2021-08-03 ENCOUNTER — Ambulatory Visit (INDEPENDENT_AMBULATORY_CARE_PROVIDER_SITE_OTHER): Payer: Commercial Managed Care - PPO | Admitting: Nurse Practitioner

## 2021-08-03 VITALS — BP 112/71 | HR 66 | Ht 72.0 in | Wt 217.0 lb

## 2021-08-03 DIAGNOSIS — E103553 Type 1 diabetes mellitus with stable proliferative diabetic retinopathy, bilateral: Secondary | ICD-10-CM

## 2021-08-03 DIAGNOSIS — E785 Hyperlipidemia, unspecified: Secondary | ICD-10-CM

## 2021-08-03 DIAGNOSIS — E663 Overweight: Secondary | ICD-10-CM | POA: Diagnosis not present

## 2021-08-03 DIAGNOSIS — I10 Essential (primary) hypertension: Secondary | ICD-10-CM

## 2021-08-03 DIAGNOSIS — R350 Frequency of micturition: Secondary | ICD-10-CM | POA: Insufficient documentation

## 2021-08-03 DIAGNOSIS — Z23 Encounter for immunization: Secondary | ICD-10-CM | POA: Diagnosis not present

## 2021-08-03 DIAGNOSIS — E1069 Type 1 diabetes mellitus with other specified complication: Secondary | ICD-10-CM | POA: Diagnosis not present

## 2021-08-03 MED ORDER — FREESTYLE LIBRE 3 SENSOR MISC: Status: DC

## 2021-08-03 MED ORDER — FREESTYLE LIBRE 14 DAY READER DEVI: Status: DC

## 2021-08-03 NOTE — Assessment & Plan Note (Addendum)
  Lab Results  Component Value Date   HGBA1C 8.0 (H) 06/24/2020   Currently on sliding scale Humalog, Tresiba 46 units daily  Need to be on a low-carb modified diet avoiding sugar sweets soda high carb foods discussed with patient he verbalized understanding. Has an upcoming appointment with the opthamologist.  Check A1c today Not on a statin check lipid panel I discussed need for referral to endocrinologist if his A1c remains uncontrolled Freestyle libre 3 ordered

## 2021-08-03 NOTE — Assessment & Plan Note (Signed)
Check PSA , urinalysis

## 2021-08-03 NOTE — Patient Instructions (Signed)
Please get your shingles vaccine and TDAP vaccine today.    It is important that you exercise regularly at least 30 minutes 5 times a week.  Think about what you will eat, plan ahead. Choose " clean, green, fresh or frozen" over canned, processed or packaged foods which are more sugary, salty and fatty. 70 to 75% of food eaten should be vegetables and fruit. Three meals at set times with snacks allowed between meals, but they must be fruit or vegetables. Aim to eat over a 12 hour period , example 7 am to 7 pm, and STOP after  your last meal of the day. Drink water,generally about 64 ounces per day, no other drink is as healthy. Fruit juice is best enjoyed in a healthy way, by EATING the fruit.  Thanks for choosing [facility], we consider it a privelige to serve you.

## 2021-08-03 NOTE — Assessment & Plan Note (Addendum)
Wt Readings from Last 3 Encounters:  08/03/21 217 lb (98.4 kg)  06/24/20 212 lb (96.2 kg)  02/05/20 220 lb (99.8 kg)  Patient counseled on low-carb diet encouraged to engage in regular moderate exercise and at least 150 minutes weekly

## 2021-08-03 NOTE — Assessment & Plan Note (Addendum)
Patient educated on CDC recommendation for theshingles vaccine. Verbal consent was obtained from the patient, vaccine administered by nurse, no sign of adverse reactions noted at this time. Patient education on arm soreness and use of tylenol  for this patient  was discussed. Patient educated on the signs and symptoms of adverse effect and advise to contact the office if they occur.  

## 2021-08-03 NOTE — Assessment & Plan Note (Signed)
Currently not on a statin Need for statin discussed with patient Check lipid panel today LDL goal is less than 70 Lab Results  Component Value Date   CHOL 165 06/24/2020   HDL 62 06/24/2020   LDLCALC 91 06/24/2020   TRIG 60 06/24/2020

## 2021-08-03 NOTE — Assessment & Plan Note (Addendum)
BP Readings from Last 3 Encounters:  08/03/21 112/71  06/24/20 (!) 149/82  02/05/20 136/81   Condition well-controlled on lisinopril 5 mg daily Continue current medication DASH diet advised engage in regular daily exercise CMP today patient

## 2021-08-03 NOTE — Assessment & Plan Note (Addendum)
Patient educated on CDC recommendation for the TDAP vaccine. Verbal consent was obtained from the patient, vaccine administered by nurse, no sign of adverse reactions noted at this time. Patient education on arm soreness and use of tylenol  for this patient  was discussed. Patient educated on the signs and symptoms of adverse effect and advise to contact the office if they occur. 

## 2021-08-03 NOTE — Progress Notes (Addendum)
KENTON FORTIN     MRN: [id number]      DOB: 1965-04-23   HPI Mr. Sean Buchanan with past medical history of hypertension, hyperlipidemia, type 1 diabetes is here for follow up and re-evaluation of chronic medical conditions, medication management and review of any available recent lab and radiology data.    Type 1 diabetes .currently on sliding scale Humalog, Tresiba 6 units daily, has freestyle libre 1, , needs the [place] 3, has had 2 hypoglycemic episodes in the past two weeks. Always has glucose item with him . Sates that his diet is not the best. Fasting CBG this morning was 155. Has polyuria.  Has upcoming diabetic eye exam.  Patient stated that he was seeing an endocrinologist in the past but does not want to go back to that particular endocrinologist.  I discussed referral to another endocrinologist increase for if his A1c remains uncontrolled.  He verbalized understanding   Has intermittent low back pain  polyuria., low urinary stream ,urinary frequency . Sometimes has burinbg sensation with urination .  Has been having the symptoms since the past 3 months. has family history of prostate.  Denies bloody urine, fever, chills, body aches. Has family history of prostate cancer   Due for shingles vaccine and TDAP vaccine.  Need for both vaccines discussed shingles vaccine and Tdap vaccines given in the office today    ROS Denies recent fever or chills. Denies sinus pressure, nasal congestion, ear pain or sore throat. Denies chest congestion, productive cough or wheezing. Denies chest pains, palpitations and leg swelling Denies abdominal pain, nausea, vomiting,diarrhea or constipation.   Denies joint pain, swelling and limitation in mobility. Denies headaches, seizures, numbness, or tingling. Denies depression, anxiety or insomnia.    PE  BP 112/71 (BP Location: Right Arm, Patient Position: Sitting, Cuff Size: Large)   Pulse 66   Ht 6' (1.829 m)   Wt 217 lb (98.4 kg)   SpO2 95%   BMI  29.43 kg/m   Patient alert and oriented and in no cardiopulmonary distress.  Chest: Clear to auscultation bilaterally.  CVS: S1, S2 no murmurs, no S3.Regular rate.  ABD: Soft non tender.   Ext: No edema  MS: Adequate ROM spine, shoulders, hips and knees.  Skin: Intact, no ulcerations or rash noted.  Psych: Good eye contact, normal affect. Memory intact not anxious or depressed appearing.  CNS: CN 2-12 intact, power,  normal throughout.no focal deficits noted.   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 Has an upcoming appointment with the opthamologist.  Check A1c today Not on a statin check lipid panel I discussed need for referral to endocrinologist if his A1c remains uncontrolled Freestyle libre 3 ordered  HTN, goal below 130/80 BP Readings from Last 3 Encounters:  08/03/21 112/71  06/24/20 (!) 149/82  02/05/20 136/81   Condition well-controlled on lisinopril 5 mg daily Continue current medication DASH diet advised engage in regular daily exercise CMP today patient  Overweight (BMI 25.0-29.9) Wt Readings from Last 3 Encounters:  08/03/21 217 lb (98.4 kg)  06/24/20 212 lb (96.2 kg)  02/05/20 220 lb (99.8 kg)  Patient counseled on low-carb diet encouraged to engage in regular  moderate exercise and at least 150 minutes weekly  Hyperlipidemia due to type 1 diabetes mellitus ([place]) Currently not on a statin Need for statin discussed with patient Check lipid panel today LDL goal is less than 70 Lab Results  Component Value Date   CHOL 165 06/24/2020   HDL 62 06/24/2020   LDLCALC 91 06/24/2020   TRIG 60 06/24/2020    Need for Tdap vaccination Patient educated  on CDC recommendation for the TDAP vaccine. Verbal consent was obtained from the patient, vaccine administered by nurse, no sign of adverse reactions noted at this time. Patient education on arm soreness and use of tylenol  for this patient  was discussed. Patient educated on the signs and symptoms of adverse effect and advise to contact the office if they occur.  Need for varicella vaccine Patient educated on CDC recommendation for the shingles  vaccine. Verbal consent was obtained from the patient, vaccine administered by nurse, no sign of adverse reactions noted at this time. Patient education on arm soreness and use of tylenol  for this patient  was discussed. Patient educated on the signs and symptoms of adverse effect and advise to contact the office if they occur.  Urinary frequency Check PSA , urinalysis

## 2021-08-04 ENCOUNTER — Other Ambulatory Visit: Payer: Self-pay | Admitting: Nurse Practitioner

## 2021-08-04 DIAGNOSIS — E1069 Type 1 diabetes mellitus with other specified complication: Secondary | ICD-10-CM

## 2021-08-04 DIAGNOSIS — E10319 Type 1 diabetes mellitus with unspecified diabetic retinopathy without macular edema: Secondary | ICD-10-CM

## 2021-08-04 DIAGNOSIS — R7401 Elevation of levels of liver transaminase levels: Secondary | ICD-10-CM

## 2021-08-04 LAB — CBC WITH DIFFERENTIAL/PLATELET
Basophils Absolute: 0 10*3/uL (ref 0.0–0.2)
Basos: 1 %
EOS (ABSOLUTE): 0.1 10*3/uL (ref 0.0–0.4)
Eos: 1 %
Hematocrit: 44.9 % (ref 37.5–51.0)
Hemoglobin: 14.8 g/dL (ref 13.0–17.7)
Immature Grans (Abs): 0 10*3/uL (ref 0.0–0.1)
Immature Granulocytes: 0 %
Lymphocytes Absolute: 1.7 10*3/uL (ref 0.7–3.1)
Lymphs: 26 %
MCH: 27.4 pg (ref 26.6–33.0)
MCHC: 33 g/dL (ref 31.5–35.7)
MCV: 83 fL (ref 79–97)
Monocytes Absolute: 0.7 10*3/uL (ref 0.1–0.9)
Monocytes: 10 %
Neutrophils Absolute: 4.1 10*3/uL (ref 1.4–7.0)
Neutrophils: 62 %
Platelets: 293 10*3/uL (ref 150–450)
RBC: 5.4 x10E6/uL (ref 4.14–5.80)
RDW: 13.4 % (ref 11.6–15.4)
WBC: 6.6 10*3/uL (ref 3.4–10.8)

## 2021-08-04 LAB — URINALYSIS
Bilirubin, UA: NEGATIVE
Ketones, UA: NEGATIVE
Leukocytes,UA: NEGATIVE
Nitrite, UA: NEGATIVE
Protein,UA: NEGATIVE
RBC, UA: NEGATIVE
Specific Gravity, UA: 1.023 (ref 1.005–1.030)
Urobilinogen, Ur: 1 mg/dL (ref 0.2–1.0)
pH, UA: 5.5 (ref 5.0–7.5)

## 2021-08-04 LAB — CMP14+EGFR
ALT: 33 IU/L (ref 0–44)
AST: 26 IU/L (ref 0–40)
Albumin/Globulin Ratio: 1.6 (ref 1.2–2.2)
Albumin: 4.1 g/dL (ref 3.8–4.9)
Alkaline Phosphatase: 134 IU/L — ABNORMAL HIGH (ref 44–121)
BUN/Creatinine Ratio: 16 (ref 9–20)
BUN: 13 mg/dL (ref 6–24)
Bilirubin Total: 0.6 mg/dL (ref 0.0–1.2)
CO2: 24 mmol/L (ref 20–29)
Calcium: 9.5 mg/dL (ref 8.7–10.2)
Chloride: 104 mmol/L (ref 96–106)
Creatinine, Ser: 0.82 mg/dL (ref 0.76–1.27)
Globulin, Total: 2.6 g/dL (ref 1.5–4.5)
Glucose: 111 mg/dL — ABNORMAL HIGH (ref 70–99)
Potassium: 4.5 mmol/L (ref 3.5–5.2)
Sodium: 142 mmol/L (ref 134–144)
Total Protein: 6.7 g/dL (ref 6.0–8.5)
eGFR: 105 mL/min/{1.73_m2} (ref >59)

## 2021-08-04 LAB — LIPID PANEL
Chol/HDL Ratio: 3.3 ratio (ref 0.0–5.0)
Cholesterol, Total: 182 mg/dL (ref 100–199)
HDL: 56 mg/dL (ref >39)
LDL Chol Calc (NIH): 114 mg/dL — ABNORMAL HIGH (ref 0–99)
Triglycerides: 66 mg/dL (ref 0–149)
VLDL Cholesterol Cal: 12 mg/dL (ref 5–40)

## 2021-08-04 LAB — PSA: Prostate Specific Ag, Serum: 1.3 ng/mL (ref 0.0–4.0)

## 2021-08-04 LAB — HEMOGLOBIN A1C
Est. average glucose Bld gHb Est-mCnc: 183 mg/dL
Hgb A1c MFr Bld: 8 % — ABNORMAL HIGH (ref 4.8–5.6)

## 2021-08-04 MED ORDER — TRESIBA FLEXTOUCH 100 UNIT/ML ~~LOC~~ SOPN: 50.0000 [IU] | PEN_INJECTOR | Freq: Every day | SUBCUTANEOUS | 1 refills | Status: DC

## 2021-08-04 MED ORDER — ATORVASTATIN CALCIUM 10 MG PO TABS: 10.0000 mg | ORAL_TABLET | Freq: Every day | ORAL | 3 refills | Status: DC

## 2021-08-04 NOTE — Progress Notes (Signed)
A1c remains uncontrolled at 8.0 start taking Tresiba 50 units daily avoid sugar sweets soda  Liver enzyme is elevated avoid Tylenol, alcohol, I added on ggt to his labs , please notify labs   LDL is not at goal of less than 70 start taking atorvastatin 10 mg daily, follow-up as planned

## 2021-08-04 NOTE — Progress Notes (Signed)
PSA is normal,CBC is normal, urinalysis is normal except that he has glucose in his urine, this is due to his uncontrolled diabetes

## 2021-08-10 ENCOUNTER — Other Ambulatory Visit: Payer: Self-pay | Admitting: Nurse Practitioner

## 2021-08-10 DIAGNOSIS — R61 Generalized hyperhidrosis: Secondary | ICD-10-CM | POA: Insufficient documentation

## 2021-08-10 DIAGNOSIS — N401 Enlarged prostate with lower urinary tract symptoms: Secondary | ICD-10-CM | POA: Insufficient documentation

## 2021-08-10 DIAGNOSIS — R000 Tachycardia, unspecified: Secondary | ICD-10-CM | POA: Insufficient documentation

## 2021-08-10 DIAGNOSIS — E10319 Type 1 diabetes mellitus with unspecified diabetic retinopathy without macular edema: Secondary | ICD-10-CM

## 2021-08-10 MED ORDER — INSULIN NPH (HUMAN) (ISOPHANE) 100 UNIT/ML ~~LOC~~ SUSP: SUBCUTANEOUS | 11 refills | Status: DC

## 2021-08-11 ENCOUNTER — Other Ambulatory Visit: Payer: Self-pay

## 2021-08-11 DIAGNOSIS — R350 Frequency of micturition: Secondary | ICD-10-CM

## 2021-10-05 ENCOUNTER — Encounter: Payer: Self-pay | Admitting: Internal Medicine

## 2021-10-05 ENCOUNTER — Ambulatory Visit (INDEPENDENT_AMBULATORY_CARE_PROVIDER_SITE_OTHER): Payer: Commercial Managed Care - PPO | Admitting: Internal Medicine

## 2021-10-05 VITALS — BP 138/70 | HR 88 | Ht 72.0 in | Wt 222.4 lb

## 2021-10-05 DIAGNOSIS — I10 Essential (primary) hypertension: Secondary | ICD-10-CM

## 2021-10-05 DIAGNOSIS — R5383 Other fatigue: Secondary | ICD-10-CM | POA: Diagnosis not present

## 2021-10-05 DIAGNOSIS — Z23 Encounter for immunization: Secondary | ICD-10-CM

## 2021-10-05 DIAGNOSIS — N529 Male erectile dysfunction, unspecified: Secondary | ICD-10-CM

## 2021-10-05 DIAGNOSIS — E1169 Type 2 diabetes mellitus with other specified complication: Secondary | ICD-10-CM

## 2021-10-05 DIAGNOSIS — Z0000 Encounter for general adult medical examination without abnormal findings: Secondary | ICD-10-CM

## 2021-10-05 DIAGNOSIS — E1069 Type 1 diabetes mellitus with other specified complication: Secondary | ICD-10-CM

## 2021-10-05 DIAGNOSIS — E103553 Type 1 diabetes mellitus with stable proliferative diabetic retinopathy, bilateral: Secondary | ICD-10-CM | POA: Diagnosis not present

## 2021-10-05 DIAGNOSIS — N521 Erectile dysfunction due to diseases classified elsewhere: Secondary | ICD-10-CM

## 2021-10-05 DIAGNOSIS — E785 Hyperlipidemia, unspecified: Secondary | ICD-10-CM

## 2021-10-05 MED ORDER — FREESTYLE LIBRE 3 SENSOR MISC: Status: DC

## 2021-10-05 MED ORDER — TADALAFIL 2.5 MG PO TABS: 2.5000 mg | ORAL_TABLET | Freq: Every day | ORAL | 1 refills | Status: DC

## 2021-10-05 NOTE — Patient Instructions (Signed)
It was a pleasure to see you today.  Thank you for giving [place] the opportunity to be involved in your care.  Below is a brief recap of your visit and next steps.  We will plan to see you again in 1 month  Summary We have completed your annual physical today I have prescribed tadalafil 2.5 mg daily and refilled your Freestyle Libre sensors  Next steps Follow up in 1 month for repeat labs, diabetic foot exam, and to see if tadalafil is effective

## 2021-10-05 NOTE — Progress Notes (Unsigned)
Complete physical exam  Patient: Sean Buchanan   DOB: 05-27-1965   56 y.o. Male  MRN: [id number]  Subjective:    Chief Complaint  Patient presents with   Annual Exam    Sean Buchanan is a 56 y.o. male who presents today for a complete physical exam. He reports consuming a general diet. The patient has a physically strenuous job, but has no regular exercise apart from work.  He generally feels fairly well. He reports sleeping poorly. He does have additional problems to discuss today.   Today Sean Buchanan would also like to discuss recent fatigue.  He is interested in having his testosterone level checked as he also endorses impotence and difficulty both achieving and sustaining erections.  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   Most recent fall risk assessment:    10/05/2021    9:13 AM  [place] in the past year? 0  Number falls in past yr: 0  Injury with Fall? 0  Risk for fall due to : No Fall Risks  Follow up Falls evaluation completed     Most recent depression screenings:    10/05/2021    9:13 AM 08/03/2021    9:05 AM  PHQ 2/9 Scores  PHQ - 2 Score 0 0    Vision:followed by Ophthalmology (Dr. Baird Cancer) for diabetic retinopathy. 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   Qualifier: Diagnosis of  By: Rebecca Eaton LPN, Jaime     Hyperlipidemia    Hypertension    IDDM (insulin dependent diabetes mellitus) 1983   AGE 36    Retinopathy, DIABETIC    Stroke ([place]) 04/20/2011   left facial weakness, and left hemiparesis   Past Surgical History:  Procedure Laterality Date   COLONOSCOPY N/A 08/30/2016   Procedure: COLONOSCOPY;  Surgeon: Danie Binder, MD;  [facility];  Service: Endoscopy;  Laterality: N/A;  12:00pm   ESOPHAGOGASTRODUODENOSCOPY N/A 08/30/2016   Procedure: ESOPHAGOGASTRODUODENOSCOPY (EGD);  Surgeon: Danie Binder, MD;  Location[facility];  Service: Endoscopy;  Laterality: N/A;   EYE SURGERY  2012   laser surgery to both eyes in July 18 and 26 , 2012   KNEE ARTHROSCOPY  1991 approx   Social History   Tobacco Use   Smoking status: Never   Smokeless tobacco: Never  Vaping Use   Vaping Use: Never used  Substance Use Topics   Alcohol use: No   Drug use: No   Family History  Problem Relation Age of Onset   Diabetes Mother    Hyperlipidemia Mother    Hypertension Mother    Rosacea Mother    GER disease Father    Hypertension Father    Colon polyps Father    Colon polyps Sister    Diabetes Sister    Prostate cancer Paternal Uncle    Colon cancer Neg Hx  No Known Allergies    Patient Care Team: Johnette Abraham, MD as PCP - General (Internal Medicine) Danie Binder, MD (Inactive) as Consulting Physician (Gastroenterology)   Outpatient Medications Prior to Visit  Medication Sig   atorvastatin (LIPITOR) 10 MG tablet Take 1 tablet (10 mg total) by mouth daily.   Continuous Blood Gluc Receiver (FREESTYLE LIBRE 14 DAY READER) DEVI Used to check blood sugar continuously DX: E10.319 90 day supply   insulin degludec (TRESIBA FLEXTOUCH) 100 UNIT/ML FlexTouch Pen Inject 50 Units into the skin daily.   lisinopril (ZESTRIL) 10 MG tablet Take 0.5 tablets (5 mg total) by mouth daily.   UNABLE TO FIND Med Name: Free Style Libre Sensors -use each for 14 days to monitor blood sugar continuously   [DISCONTINUED] Continuous Blood Gluc Sensor (FREESTYLE LIBRE 14 DAY SENSOR) MISC  SMARTSIG:1 Topical Every 2 Weeks   [DISCONTINUED] Continuous Blood Gluc Sensor (FREESTYLE LIBRE 3 SENSOR) MISC Used to check blood sugar topical every 2 weeks E10.319 90 day supply   [DISCONTINUED] insulin NPH Human (HUMULIN N) 100 UNIT/ML injection Inject 0.1-0.3 mLs (10-30 Units total) into the skin 3 (three) times daily with meals. Per sliding scale (averages ~20 units per meal. Take 30 minutes prior to meal   No facility-administered medications prior to visit.   Review of Systems  Constitutional:  Positive for malaise/fatigue. Negative for chills and fever.  HENT:  Negative for sore throat.   Respiratory:  Negative for cough and shortness of breath.   Cardiovascular:  Negative for chest pain, palpitations and leg swelling.  Gastrointestinal:  Negative for abdominal pain, blood in stool, constipation, diarrhea, nausea and vomiting.  Genitourinary:  Negative for dysuria and hematuria.       Erectile dysfunction  Musculoskeletal:  Negative for myalgias.  Skin:  Negative for itching and rash.  Neurological:  Negative for dizziness and headaches.  Psychiatric/Behavioral:  Negative for depression and suicidal ideas.       Objective:     BP 138/70 (BP Location: Left Arm)   Pulse 88   Ht 6' (1.829 m)   Wt 222 lb 6.4 oz (100.9 kg)   SpO2 95%   BMI 30.16 kg/m  BP Readings from Last 3 Encounters:  10/05/21 138/70  08/03/21 112/71  06/24/20 (!) 149/82   Physical Exam Vitals reviewed.  Constitutional:      General: He is not in acute distress.    Appearance: Normal appearance. He is not ill-appearing.  HENT:     Head: Normocephalic and atraumatic.     Nose: Nose normal. No congestion or rhinorrhea.     Mouth/Throat:     Mouth: Mucous membranes are moist.     Pharynx: Oropharynx is clear.  Eyes:     Extraocular Movements: Extraocular movements intact.     Conjunctiva/sclera: Conjunctivae normal.     Pupils: Pupils are equal, round, and reactive to light.  Cardiovascular:      Rate and Rhythm: Normal rate and regular rhythm.     Pulses: Normal pulses.     Heart sounds: Normal heart sounds. No murmur heard. Pulmonary:     Effort: Pulmonary effort is normal.     Breath sounds: Normal breath sounds. No wheezing, rhonchi or rales.  Abdominal:     General: Abdomen is flat. Bowel sounds are normal. There is no distension.     Palpations: Abdomen is soft.     Tenderness: There is no abdominal tenderness.  Musculoskeletal:  General: No swelling or deformity. Normal range of motion.     Cervical back: Normal range of motion.  Skin:    General: Skin is warm and dry.     Capillary Refill: Capillary refill takes less than 2 seconds.  Neurological:     General: No focal deficit present.     Mental Status: He is alert and oriented to person, place, and time.     Motor: No weakness.  Psychiatric:        Mood and Affect: Mood normal.        Behavior: Behavior normal.        Thought Content: Thought content normal.    Last CBC Lab Results  Component Value Date   WBC 6.6 08/03/2021   HGB 14.8 08/03/2021   HCT 44.9 08/03/2021   MCV 83 08/03/2021   MCH 27.4 08/03/2021   RDW 13.4 08/03/2021   PLT 293 86/76/1950   Last metabolic panel Lab Results  Component Value Date   GLUCOSE 111 (H) 08/03/2021   NA 142 08/03/2021   K 4.5 08/03/2021   CL 104 08/03/2021   CO2 24 08/03/2021   BUN 13 08/03/2021   CREATININE 0.82 08/03/2021   EGFR 105 08/03/2021   CALCIUM 9.5 08/03/2021   PROT 6.7 08/03/2021   ALBUMIN 4.1 08/03/2021   LABGLOB 2.6 08/03/2021   AGRATIO 1.6 08/03/2021   BILITOT 0.6 08/03/2021   ALKPHOS 134 (H) 08/03/2021   AST 26 08/03/2021   ALT 33 08/03/2021   ANIONGAP 10 06/27/2018   Last lipids Lab Results  Component Value Date   CHOL 182 08/03/2021   HDL 56 08/03/2021   LDLCALC 114 (H) 08/03/2021   TRIG 66 08/03/2021   CHOLHDL 3.3 08/03/2021   Last hemoglobin A1c Lab Results  Component Value Date   HGBA1C 8.0 (H) 08/03/2021   Last  thyroid functions Lab Results  Component Value Date   TSH 2.419 10/15/2009   Last vitamin D Lab Results  Component Value Date   VD25OH 32 10/15/2009       Assessment & Plan:    Routine Health Maintenance and Physical Exam  Immunization History  Administered Date(s) Administered   Influenza,inj,Quad PF,6+ Mos 10/05/2021   Moderna Sars-Covid-2 Vaccination 01/02/2019, 01/30/2019, 01/09/2020   Pneumococcal Polysaccharide-23 12/08/2009   Td 12/08/2009   Tdap 08/03/2021   Zoster Recombinat (Shingrix) 08/03/2021, 10/05/2021    Health Maintenance  Topic Date Due   COVID-19 Vaccine (4 - Moderna series) 03/05/2020   OPHTHALMOLOGY EXAM  06/03/2020   Diabetic kidney evaluation - Urine ACR  06/24/2021   FOOT EXAM  06/24/2021   HEMOGLOBIN A1C  02/03/2022   Diabetic kidney evaluation - GFR measurement  08/04/2022   COLONOSCOPY (Pts 45-37yr Insurance coverage will need to be confirmed)  08/31/2026   TETANUS/TDAP  08/04/2031   INFLUENZA VACCINE  Completed   Hepatitis C Screening  Completed   HIV Screening  Completed   Zoster Vaccines- Shingrix  Completed   HPV VACCINES  Aged Out    Discussed health benefits of physical activity, and encouraged him to engage in regular exercise appropriate for his age and condition.  Problem List Items Addressed This Visit       HTN, goal below 130/80    Initial BP 163/85 today.  Improved to 138/70 on repeat.  He is currently prescribed lisinopril 5 mg daily. -No changes today -Follow-up BP in 1 month      [place] to [place]  Recently started on atorvastatin 10 mg daily.  Lipid panel updated in August 2023.  LDL 114.  Plan to repeat lipid panel at follow-up in 1 month.      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  He is currently injecting [place] 40 units daily.  This is a decrease in dosage due to symptomatic hypoglycemia.  He was also  previously referred to endocrinology but has not been contacted to make an appointment.  We will check on the status of this referral. -No changes today -Repeat HbA1c in 1 month -He is followed by an ophthalmologist in [place] (Dr. Baird Cancer) for diabetic retinopathy.  He currently has follow-up scheduled for 10/18. -Outstanding diabetes related preventative healthcare maintenance items to be addressed at follow-up in 1 month.      Erectile dysfunction    Likely related to diabetes.  He describes difficulty both achieving and sustaining erection.  He states he has tried sildenafil previously but it was not effective.  He would like to try tadalafil today. -Tadalafil 2.5 mg daily prescribed.  He prefers a daily medication instead of taking for as needed use.      Annual physical exam    Presenting today for his annual physical exam.  Recent labs reviewed.  Outstanding preventative healthcare maintenance items discussed. -Influenza and Shingrix vaccines administered today -He states he will receive his COVID-19 booster at his pharmacy -Sean Buchanan has deferred his outstanding diabetes related healthcare maintenance items until his follow-up appointment as he has been working all night.  This will be completed at follow-up in 1 month      Fatigue    He endorses recent fatigue and request to have his testosterone level checked. -AM free/total testosterone level ordered.  He will return 1 day later this week to have labs completed.      Return in about 4 weeks (around 11/02/2021).  Johnette Abraham, MD

## 2021-10-06 DIAGNOSIS — Z0000 Encounter for general adult medical examination without abnormal findings: Secondary | ICD-10-CM | POA: Insufficient documentation

## 2021-10-06 DIAGNOSIS — R5383 Other fatigue: Secondary | ICD-10-CM | POA: Insufficient documentation

## 2021-10-06 NOTE — Assessment & Plan Note (Signed)
Recently started on atorvastatin 10 mg daily.  Lipid panel updated in August 2023.  LDL 114.  Plan to repeat lipid panel at follow-up in 1 month.

## 2021-10-06 NOTE — Assessment & Plan Note (Signed)
Most recent HgbA1c 8.0 in August.  He is currently injecting [place] 40 units daily.  This is a decrease in dosage due to symptomatic hypoglycemia.  He was also previously referred to endocrinology but has not been contacted to make an appointment.  We will check on the status of this referral. -No changes today -Repeat HbA1c in 1 month -He is followed by an ophthalmologist in [place] (Dr. Baird Cancer) for diabetic retinopathy.  He currently has follow-up scheduled for 10/18. -Outstanding diabetes related preventative healthcare maintenance items to be addressed at follow-up in 1 month.

## 2021-10-06 NOTE — Assessment & Plan Note (Signed)
He endorses recent fatigue and request to have his testosterone level checked. -AM free/total testosterone level ordered.  He will return 1 day later this week to have labs completed.

## 2021-10-06 NOTE — Assessment & Plan Note (Signed)
Initial BP 163/85 today.  Improved to 138/70 on repeat.  He is currently prescribed lisinopril 5 mg daily. -No changes today -Follow-up BP in 1 month

## 2021-10-06 NOTE — Assessment & Plan Note (Signed)
Likely related to diabetes.  He describes difficulty both achieving and sustaining erection.  He states he has tried sildenafil previously but it was not effective.  He would like to try tadalafil today. -Tadalafil 2.5 mg daily prescribed.  He prefers a daily medication instead of taking for as needed use.

## 2021-10-06 NOTE — Assessment & Plan Note (Signed)
Presenting today for his annual physical exam.  Recent labs reviewed.  Outstanding preventative healthcare maintenance items discussed. -Influenza and Shingrix vaccines administered today -He states he will receive his COVID-19 booster at his pharmacy -Sean Buchanan has deferred his outstanding diabetes related healthcare maintenance items until his follow-up appointment as he has been working all night.  This will be completed at follow-up in 1 month

## 2021-10-25 ENCOUNTER — Other Ambulatory Visit: Payer: Self-pay

## 2021-10-25 ENCOUNTER — Telehealth: Payer: Self-pay | Admitting: Internal Medicine

## 2021-10-25 DIAGNOSIS — E103553 Type 1 diabetes mellitus with stable proliferative diabetic retinopathy, bilateral: Secondary | ICD-10-CM

## 2021-10-25 MED ORDER — FREESTYLE LIBRE 3 SENSOR MISC: Status: DC

## 2021-10-25 MED ORDER — FREESTYLE LIBRE 3 SENSOR MISC: 2 refills | Status: DC

## 2021-10-25 NOTE — Telephone Encounter (Signed)
Pt called stating his rx for  was sent in for 1 sensor. States he needs 6 sensors to cover him for 3 months. Can you please write in 3 month supply?   [facility] [address]

## 2021-10-25 NOTE — Telephone Encounter (Signed)
Continuous Blood Gluc Sensor (FREESTYLE LIBRE 3 SENSOR) MISC

## 2021-11-02 ENCOUNTER — Ambulatory Visit (INDEPENDENT_AMBULATORY_CARE_PROVIDER_SITE_OTHER): Payer: Commercial Managed Care - PPO | Admitting: Internal Medicine

## 2021-11-02 ENCOUNTER — Encounter: Payer: Self-pay | Admitting: Internal Medicine

## 2021-11-02 VITALS — BP 158/76 | HR 78 | Ht 72.0 in | Wt 219.6 lb

## 2021-11-02 DIAGNOSIS — E10319 Type 1 diabetes mellitus with unspecified diabetic retinopathy without macular edema: Secondary | ICD-10-CM | POA: Diagnosis not present

## 2021-11-02 DIAGNOSIS — R748 Abnormal levels of other serum enzymes: Secondary | ICD-10-CM

## 2021-11-02 DIAGNOSIS — R110 Nausea: Secondary | ICD-10-CM | POA: Diagnosis not present

## 2021-11-02 DIAGNOSIS — E1069 Type 1 diabetes mellitus with other specified complication: Secondary | ICD-10-CM

## 2021-11-02 DIAGNOSIS — E785 Hyperlipidemia, unspecified: Secondary | ICD-10-CM

## 2021-11-02 DIAGNOSIS — I10 Essential (primary) hypertension: Secondary | ICD-10-CM

## 2021-11-02 DIAGNOSIS — R5383 Other fatigue: Secondary | ICD-10-CM

## 2021-11-02 MED ORDER — ONDANSETRON HCL 4 MG PO TABS: 4.0000 mg | ORAL_TABLET | Freq: Three times a day (TID) | ORAL | 0 refills | Status: DC | PRN

## 2021-11-02 MED ORDER — LISINOPRIL 10 MG PO TABS: 10.0000 mg | ORAL_TABLET | Freq: Every day | ORAL | 1 refills | Status: DC

## 2021-11-02 NOTE — Assessment & Plan Note (Signed)
Lipid panel updated in August.  Total cholesterol 182, LDL 114.  He was then started on atorvastatin 10 mg daily. -Repeat lipid panel ordered today

## 2021-11-02 NOTE — Assessment & Plan Note (Signed)
Alkaline phosphatase level chronically elevated.  GGT has not been checked. -Repeat CMP and GGT ordered today

## 2021-11-02 NOTE — Progress Notes (Signed)
Established Patient Office Visit  Subjective   Patient ID: Sean Buchanan, male    DOB: 15-May-1965  Age: 56 y.o. MRN: [id number]  Chief Complaint  Patient presents with   Follow-up   Mr. Turnley returns to care today.  He is a 56 year old male last seen by me on 10/3 for his annual exam.  At that time his blood pressure was elevated.  Follow-up scheduled for 1 month to address elevated BP, repeat labs, and outstanding preventative care items.  There have been no acute interval events.  Today Mr. Siegmann states that he feels well.  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  He is acute concern today is recent nausea.  He is requesting a prescription for Zofran for as needed use.  He endorsed fatigue at his last appointment, but states it has improved.  A.m. testosterone level is still pending.  He has no additional concerns to discuss today.  Acute concerns, chronic medical conditions, and outstanding preventative care items discussed today are individually addressed in A/P below.  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   Qualifier: Diagnosis of  By: Rebecca Eaton LPN, Jaime     Hyperlipidemia    Hypertension    IDDM (insulin dependent diabetes mellitus) 1983   AGE 78   Retinopathy, DIABETIC    Stroke ([place]) 04/20/2011   left facial weakness, and left hemiparesis   Past Surgical History:  Procedure Laterality Date   COLONOSCOPY N/A 08/30/2016   Procedure: COLONOSCOPY;  Surgeon: Danie Binder, MD;  [facility];  Service: Endoscopy;  Laterality: N/A;   12:00pm   ESOPHAGOGASTRODUODENOSCOPY N/A 08/30/2016   Procedure: ESOPHAGOGASTRODUODENOSCOPY (EGD);  Surgeon: Danie Binder, MD;  [facility];  Service: Endoscopy;  Laterality: N/A;   EYE SURGERY  2012   laser surgery to both eyes in July 18 and 26 , 2012   KNEE ARTHROSCOPY  1991 approx   Social History   Tobacco Use   Smoking status: Never   Smokeless tobacco: Never  Vaping Use   Vaping Use: Never used  Substance Use Topics   Alcohol use: No   Drug use: No   Family History  Problem Relation Age of Onset   Diabetes Mother    Hyperlipidemia Mother    Hypertension Mother    Rosacea Mother    GER disease Father    Hypertension Father    Colon polyps Father    Colon polyps Sister    Diabetes Sister    Prostate cancer Paternal Uncle    Colon cancer Neg Hx    No Known Allergies  Review of Systems  Constitutional:  Positive for malaise/fatigue (fatigue improving compared to 10/3).  Gastrointestinal:  Positive for nausea. Negative for vomiting.  All other systems reviewed and are negative.    Objective:     BP (!) 158/76   Pulse 78   Ht 6' (1.829 m)   Wt 219 lb 9.6 oz (99.6 kg)   SpO2 96%   BMI 29.78  kg/m  BP Readings from Last 3 Encounters:  11/02/21 (!) 158/76  10/05/21 138/70  08/03/21 112/71   Physical Exam Vitals reviewed.  Constitutional:      General: He is not in acute distress.    Appearance: Normal appearance. He is not ill-appearing.  HENT:     Head: Normocephalic and atraumatic.     Right Ear: External ear normal.     Left Ear: External ear normal.     Nose: Nose normal. No congestion or rhinorrhea.     Mouth/Throat:     Mouth: Mucous membranes are moist.     Pharynx: Oropharynx is clear.  Eyes:     General: No scleral icterus.    Extraocular Movements: Extraocular movements intact.     Conjunctiva/sclera: Conjunctivae normal.     Pupils: Pupils are equal, round, and reactive to light.  Cardiovascular:     Rate and Rhythm:  Normal rate and regular rhythm.     Pulses: Normal pulses.     Heart sounds: Normal heart sounds. No murmur heard. Pulmonary:     Effort: Pulmonary effort is normal.     Breath sounds: Normal breath sounds. No wheezing, rhonchi or rales.  Abdominal:     General: Abdomen is flat. Bowel sounds are normal. There is no distension.     Palpations: Abdomen is soft.     Tenderness: There is no abdominal tenderness.  Musculoskeletal:        General: No swelling or deformity. Normal range of motion.     Cervical back: Normal range of motion.  Skin:    General: Skin is warm and dry.     Capillary Refill: Capillary refill takes less than 2 seconds.  Neurological:     General: No focal deficit present.     Mental Status: He is alert and oriented to person, place, and time.     Motor: No weakness.  Psychiatric:        Mood and Affect: Mood normal.        Behavior: Behavior normal.        Thought Content: Thought content normal.    Diabetic foot exam was performed.  No deformities or other abnormal visual findings.  Posterior tibialis and dorsalis pulse intact bilaterally.  Intact to touch and monofilament testing bilaterally.    Last CBC Lab Results  Component Value Date   WBC 6.6 08/03/2021   HGB 14.8 08/03/2021   HCT 44.9 08/03/2021   MCV 83 08/03/2021   MCH 27.4 08/03/2021   RDW 13.4 08/03/2021   PLT 293 26/37/8588   Last metabolic panel Lab Results  Component Value Date   GLUCOSE 111 (H) 08/03/2021   NA 142 08/03/2021   K 4.5 08/03/2021   CL 104 08/03/2021   CO2 24 08/03/2021   BUN 13 08/03/2021   CREATININE 0.82 08/03/2021   EGFR 105 08/03/2021   CALCIUM 9.5 08/03/2021   PROT 6.7 08/03/2021   ALBUMIN 4.1 08/03/2021   LABGLOB 2.6 08/03/2021   AGRATIO 1.6 08/03/2021   BILITOT 0.6 08/03/2021   ALKPHOS 134 (H) 08/03/2021   AST 26 08/03/2021   ALT 33 08/03/2021   ANIONGAP 10 06/27/2018   Last lipids Lab Results  Component Value Date   CHOL 182 08/03/2021   HDL  56 08/03/2021   LDLCALC 114 (H) 08/03/2021   TRIG 66 08/03/2021   CHOLHDL 3.3 08/03/2021   Last hemoglobin A1c Lab Results  Component Value Date   HGBA1C 8.0 (H) 08/03/2021  Last thyroid functions Lab Results  Component Value Date   TSH 2.419 10/15/2009   Last vitamin D Lab Results  Component Value Date   VD25OH 32 10/15/2009     The 10-year ASCVD risk score (Arnett DK, et al., 2019) is: 11.8%    Assessment & Plan:   Problem List Items Addressed This Visit       HTN, goal below 130/80    BP elevated again today, 158/76.  He is currently prescribed lisinopril 5 mg daily. -Increase lisinopril to 10 mg daily.  He will follow-up in 1 month for BP check.  In the interim, I have asked him to check his blood pressure regularly and keep a log.      Hyperlipidemia due to type 1 diabetes mellitus ([place])    Lipid panel updated in August.  Total cholesterol 182, LDL 114.  He was then started on atorvastatin 10 mg daily. 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  His current insulin regimen consist of Tresiba 40-50 units daily depending on his blood sugar before injecting, and Humalog 15-20 units with meals.  He denies polyuria/polydipsia. -Diabetic foot exam completed today -Urine microalbumin/creatinine ratio ordered -He was previously referred to endocrinology and plans to establish care with [facility] (Dr. Dwyane Dee).      Fatigue    He endorses some improvement compared to earlier this month.  A.m. Free/total testosterone level still pending.  He denies melena/hematochezia. -Iron studies added to labs today      Nausea    Today endorses recent nausea without vomiting and requests a prescription for Zofran. -Zofran 4 mg for as needed use prescribed      Elevated alkaline phosphatase level - Primary    Alkaline phosphatase level chronically elevated.  GGT has not been  checked. -Repeat CMP and GGT ordered today      Return in about 4 weeks (around 11/30/2021).    Johnette Abraham, MD

## 2021-11-02 NOTE — Assessment & Plan Note (Signed)
Today endorses recent nausea without vomiting and requests a prescription for Zofran. -Zofran 4 mg for as needed use prescribed

## 2021-11-02 NOTE — Patient Instructions (Signed)
It was a pleasure to see you today.  Thank you for giving [place] the opportunity to be involved in your care.  Below is a brief recap of your visit and next steps.  We will plan to see you again in 4 weeks.  Summary I have increased lisinopril to 10 mg daily We will check your urine for protein today as well as repeat a cholesterol panel and labs related to your liver. Follow up in 1 month for BP check. In the interim, please check continue to check your blood pressure on the truck.

## 2021-11-02 NOTE — Assessment & Plan Note (Signed)
He endorses some improvement compared to earlier this month.  A.m. Free/total testosterone level still pending.  He denies melena/hematochezia. -Iron studies added to labs today

## 2021-11-02 NOTE — Assessment & Plan Note (Signed)
A1c 8.0 in August.  His current insulin regimen consist of Tresiba 40-50 units daily depending on his blood sugar before injecting, and Humalog 15-20 units with meals.  He denies polyuria/polydipsia. -Diabetic foot exam completed today -Urine microalbumin/creatinine ratio ordered -He was previously referred to endocrinology and plans to establish care with [facility] (Dr. Dwyane Dee).

## 2021-11-02 NOTE — Assessment & Plan Note (Signed)
BP elevated again today, 158/76.  He is currently prescribed lisinopril 5 mg daily. -Increase lisinopril to 10 mg daily.  He will follow-up in 1 month for BP check.  In the interim, I have asked him to check his blood pressure regularly and keep a log.

## 2021-11-03 LAB — LIPID PANEL
Chol/HDL Ratio: 3 ratio (ref 0.0–5.0)
Cholesterol, Total: 167 mg/dL (ref 100–199)
HDL: 55 mg/dL (ref >39)
LDL Chol Calc (NIH): 98 mg/dL (ref 0–99)
Triglycerides: 76 mg/dL (ref 0–149)
VLDL Cholesterol Cal: 14 mg/dL (ref 5–40)

## 2021-11-03 LAB — CMP14+EGFR
ALT: 27 IU/L (ref 0–44)
AST: 24 IU/L (ref 0–40)
Albumin/Globulin Ratio: 1.6 (ref 1.2–2.2)
Albumin: 4.1 g/dL (ref 3.8–4.9)
Alkaline Phosphatase: 167 IU/L — ABNORMAL HIGH (ref 44–121)
BUN/Creatinine Ratio: 18 (ref 9–20)
BUN: 14 mg/dL (ref 6–24)
Bilirubin Total: 0.3 mg/dL (ref 0.0–1.2)
CO2: 20 mmol/L (ref 20–29)
Calcium: 9.4 mg/dL (ref 8.7–10.2)
Chloride: 104 mmol/L (ref 96–106)
Creatinine, Ser: 0.76 mg/dL (ref 0.76–1.27)
Globulin, Total: 2.6 g/dL (ref 1.5–4.5)
Glucose: 176 mg/dL — ABNORMAL HIGH (ref 70–99)
Potassium: 4.1 mmol/L (ref 3.5–5.2)
Sodium: 139 mmol/L (ref 134–144)
Total Protein: 6.7 g/dL (ref 6.0–8.5)
eGFR: 107 mL/min/{1.73_m2} (ref >59)

## 2021-11-03 LAB — IRON,TIBC AND FERRITIN PANEL
Ferritin: 88 ng/mL (ref 30–400)
Iron Saturation: 13 % — ABNORMAL LOW (ref 15–55)
Iron: 42 ug/dL (ref 38–169)
Total Iron Binding Capacity: 318 ug/dL (ref 250–450)
UIBC: 276 ug/dL (ref 111–343)

## 2021-11-03 LAB — GAMMA GT: GGT: 15 IU/L (ref 0–65)

## 2021-11-04 LAB — MICROALBUMIN / CREATININE URINE RATIO
Creatinine, Urine: 59.4 mg/dL
Microalb/Creat Ratio: 13 mg/g creat (ref 0–29)
Microalbumin, Urine: 7.6 ug/mL

## 2021-11-16 ENCOUNTER — Telehealth: Payer: Self-pay | Admitting: Internal Medicine

## 2021-11-16 ENCOUNTER — Other Ambulatory Visit: Payer: Self-pay

## 2021-11-16 DIAGNOSIS — R748 Abnormal levels of other serum enzymes: Secondary | ICD-10-CM

## 2021-11-16 DIAGNOSIS — R5383 Other fatigue: Secondary | ICD-10-CM

## 2021-11-16 NOTE — Telephone Encounter (Signed)
Patient called asked if he could do all his blood work tomorrow instead of splitting it up.  Please call patient to let patient know if this is okay.

## 2021-11-21 LAB — TESTOSTERONE,FREE AND TOTAL
Testosterone, Free: 7.4 pg/mL (ref 7.2–24.0)
Testosterone: 446 ng/dL (ref 264–916)

## 2021-11-21 LAB — VITAMIN D 25 HYDROXY (VIT D DEFICIENCY, FRACTURES): Vit D, 25-Hydroxy: 29.3 ng/mL — ABNORMAL LOW (ref 30.0–100.0)

## 2021-11-22 ENCOUNTER — Other Ambulatory Visit: Payer: Self-pay

## 2021-11-22 MED ORDER — INSULIN GLARGINE-YFGN 100 UNIT/ML ~~LOC~~ SOPN: 40.0000 [IU] | PEN_INJECTOR | Freq: Every day | SUBCUTANEOUS | 5 refills | Status: DC

## 2021-12-01 ENCOUNTER — Encounter: Payer: Self-pay | Admitting: Internal Medicine

## 2021-12-01 ENCOUNTER — Ambulatory Visit (INDEPENDENT_AMBULATORY_CARE_PROVIDER_SITE_OTHER): Payer: Commercial Managed Care - PPO | Admitting: Internal Medicine

## 2021-12-01 ENCOUNTER — Encounter: Payer: Self-pay | Admitting: *Deleted

## 2021-12-01 VITALS — BP 143/77 | HR 78 | Ht 72.0 in | Wt 218.0 lb

## 2021-12-01 DIAGNOSIS — Z9889 Other specified postprocedural states: Secondary | ICD-10-CM

## 2021-12-01 DIAGNOSIS — I10 Essential (primary) hypertension: Secondary | ICD-10-CM

## 2021-12-01 DIAGNOSIS — E611 Iron deficiency: Secondary | ICD-10-CM

## 2021-12-01 DIAGNOSIS — K635 Polyp of colon: Secondary | ICD-10-CM

## 2021-12-01 DIAGNOSIS — R5383 Other fatigue: Secondary | ICD-10-CM

## 2021-12-01 DIAGNOSIS — R748 Abnormal levels of other serum enzymes: Secondary | ICD-10-CM

## 2021-12-01 DIAGNOSIS — E1069 Type 1 diabetes mellitus with other specified complication: Secondary | ICD-10-CM

## 2021-12-01 DIAGNOSIS — E785 Hyperlipidemia, unspecified: Secondary | ICD-10-CM

## 2021-12-01 MED ORDER — LISINOPRIL 20 MG PO TABS: 20.0000 mg | ORAL_TABLET | Freq: Every day | ORAL | 1 refills | Status: DC

## 2021-12-01 MED ORDER — ATORVASTATIN CALCIUM 20 MG PO TABS: 20.0000 mg | ORAL_TABLET | Freq: Every day | ORAL | 1 refills | Status: DC

## 2021-12-01 NOTE — Assessment & Plan Note (Addendum)
GGT within normal limits recently.  We reviewed today that there are several causes of elevated alkaline phosphatase levels.  He is concerned about this due to a family history of bone malignancy.  -SPEP ordered today -Referred to gastroenterology as otherwise documented.

## 2021-12-01 NOTE — Assessment & Plan Note (Addendum)
Noted on recent labs.  He is currently on iron supplementation.  Repeat iron studies at follow-up in 4 weeks.

## 2021-12-01 NOTE — Assessment & Plan Note (Addendum)
Lisinopril was recently increased to 10 mg daily.  His blood pressure today is 143/77. -Increase lisinopril to 20 mg daily. -Follow-up in 4 weeks for HTN

## 2021-12-01 NOTE — Assessment & Plan Note (Signed)
He continues to endorse fatigue today and believes that it has worsened over the last month.  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  Iron deficiency was noted on previous labs, but he was not anemic and his lab work was otherwise unremarkable. -Additional labs added today to further evaluate elevated alkaline phosphatase level -Continue iron supplementation -Follow-up in 4 weeks for reassessment

## 2021-12-01 NOTE — Patient Instructions (Signed)
It was a pleasure to see you today.  Thank you for giving [place] the opportunity to be involved in your care.  Below is a brief recap of your visit and next steps.  We will plan to see you again in 4 weeks.   Summary Increase lisinopril to 20 mg daily and increase atorvastatin to 20 mg daily We will check labs today I have placed a referral to GI for repeat colonoscopy Follow up in 4 weeks

## 2021-12-01 NOTE — Progress Notes (Signed)
Established Patient Office Visit  Subjective   Patient ID: JAMMAL SARR, male    DOB: [birth year]  Age: 56 y.o. MRN: [id number]  Chief Complaint  Patient presents with   Follow-up    BP and labs   Mr. Boggus returns to care today.  He was last seen by me on 10/31 at which time he endorsed nausea and requested a prescription for Zofran for as needed use.  Lisinopril was increased to 10 mg daily and 1 month follow-up was arranged for BP check.  Repeat labs were ordered to evaluate for an elevated alkaline phosphatase level in addition to completing a work-up for fatigue.  There have been no acute interval events.  Today Mr. Dubuc continues to endorse fatigue.  He is also concerned about the elevated alkaline phosphatase level as he has a family history significant for malignancy, including malignancies.  He would like to know what the next steps are in terms of work-up items.  Acute concerns, chronic medical conditions, and outstanding preventative care items discussed today are individually addressed in A/P below.  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   Qualifier: Diagnosis of  By: Rebecca Eaton LPN, Jaime     Hyperlipidemia    Hypertension    IDDM (insulin dependent diabetes mellitus) 1983   AGE 85   Retinopathy, DIABETIC    Stroke ([place]) 04/20/2011   left facial weakness, and left hemiparesis   Past Surgical History:  Procedure Laterality Date   COLONOSCOPY N/A 08/30/2016   Procedure: COLONOSCOPY;  Surgeon: Danie Binder, MD;  Location: [facility];  Service: Endoscopy;   Laterality: N/A;  12:00pm   ESOPHAGOGASTRODUODENOSCOPY N/A 08/30/2016   Procedure: ESOPHAGOGASTRODUODENOSCOPY (EGD);  Surgeon: Danie Binder, MD;  Location: [facility];  Service: Endoscopy;  Laterality: N/A;   EYE SURGERY  2012   laser surgery to both eyes in July 18 and 26 , 2012   KNEE ARTHROSCOPY  1991 approx   Social History   Tobacco Use   Smoking status: Never   Smokeless tobacco: Never  Vaping Use   Vaping Use: Never used  Substance Use Topics   Alcohol use: No   Drug use: No   Family History  Problem Relation Age of Onset   Diabetes Mother    Hyperlipidemia Mother    Hypertension Mother    Rosacea Mother    GER disease Father    Hypertension Father    Colon polyps Father    Colon polyps Sister    Diabetes Sister    Prostate cancer Paternal Uncle    Colon cancer Neg Hx    No Known Allergies  Review of Systems  Constitutional:  Positive for malaise/fatigue.  All other systems reviewed and are negative.    Objective:     BP (!) 143/77   Pulse 78   Ht 6' (1.829 m)   Wt 218 lb (98.9 kg)   SpO2 95%   BMI 29.57 kg/m  BP Readings from Last 3 Encounters:  12/01/21 (!) 143/77  11/02/21 Marland Kitchen)  158/76  10/05/21 138/70   Physical Exam Vitals reviewed.  Constitutional:      General: He is not in acute distress.    Appearance: Normal appearance. He is not ill-appearing.  HENT:     Head: Normocephalic and atraumatic.     Right Ear: External ear normal.     Left Ear: External ear normal.     Nose: Nose normal. No congestion or rhinorrhea.     Mouth/Throat:     Mouth: Mucous membranes are moist.     Pharynx: Oropharynx is clear.  Eyes:     General: No scleral icterus.    Extraocular Movements: Extraocular movements intact.     Conjunctiva/sclera: Conjunctivae normal.     Pupils: Pupils are equal, round, and reactive to light.  Cardiovascular:     Rate and Rhythm: Normal rate and regular rhythm.     Pulses: Normal pulses.     Heart sounds: Normal heart  sounds. No murmur heard. Pulmonary:     Effort: Pulmonary effort is normal.     Breath sounds: Normal breath sounds. No wheezing, rhonchi or rales.  Abdominal:     General: Abdomen is flat. Bowel sounds are normal. There is no distension.     Palpations: Abdomen is soft.     Tenderness: There is no abdominal tenderness.  Musculoskeletal:        General: No swelling or deformity. Normal range of motion.     Cervical back: Normal range of motion.  Skin:    General: Skin is warm and dry.     Capillary Refill: Capillary refill takes less than 2 seconds.  Neurological:     General: No focal deficit present.     Mental Status: He is alert and oriented to person, place, and time.     Motor: No weakness.  Psychiatric:        Mood and Affect: Mood normal.        Behavior: Behavior normal.        Thought Content: Thought content normal.    Last CBC Lab Results  Component Value Date   WBC 6.6 08/03/2021   HGB 14.8 08/03/2021   HCT 44.9 08/03/2021   MCV 83 08/03/2021   MCH 27.4 08/03/2021   RDW 13.4 08/03/2021   PLT 293 48/54/6270   Last metabolic panel Lab Results  Component Value Date   GLUCOSE 176 (H) 11/02/2021   NA 139 11/02/2021   K 4.1 11/02/2021   CL 104 11/02/2021   CO2 20 11/02/2021   BUN 14 11/02/2021   CREATININE 0.76 11/02/2021   EGFR 107 11/02/2021   CALCIUM 9.4 11/02/2021   PROT 6.7 11/02/2021   ALBUMIN 4.1 11/02/2021   LABGLOB 2.6 11/02/2021   AGRATIO 1.6 11/02/2021   BILITOT 0.3 11/02/2021   ALKPHOS 167 (H) 11/02/2021   AST 24 11/02/2021   ALT 27 11/02/2021   ANIONGAP 10 06/27/2018   Last lipids Lab Results  Component Value Date   CHOL 167 11/02/2021   HDL 55 11/02/2021   LDLCALC 98 11/02/2021   TRIG 76 11/02/2021   CHOLHDL 3.0 11/02/2021   Last hemoglobin A1c Lab Results  Component Value Date   HGBA1C 8.0 (H) 08/03/2021   Last thyroid functions Lab Results  Component Value Date   TSH 2.419 10/15/2009   Last vitamin D Lab Results   Component Value Date   VD25OH 29.3 (L) 11/17/2021   The 10-year ASCVD risk score (Arnett DK, et al., 2019) is: 9.1%  Assessment & Plan:   Problem List Items Addressed This Visit       HTN, goal below 130/80    Lisinopril was recently increased to 10 mg daily.  His blood pressure today is 143/77. -Increase lisinopril to 20 mg daily. -Follow-up in 4 weeks for HTN      Hyperlipidemia due to type 1 diabetes mellitus (HCC)    Atorvastatin 10 mg daily was started based on results of the lipid panel from August.  Repeat lipid panel was ordered at his last appointment in October.  His total cholesterol was 167 and LDL 98.  These are both improved, however not at goal. -Increase atorvastatin to 20 mg daily      Fatigue    He continues to endorse fatigue today and believes that it has worsened over the last month.  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  Iron deficiency was noted on previous labs, but he was not anemic and his lab work was otherwise unremarkable. -Additional labs added today to further evaluate elevated alkaline phosphatase level -Continue iron supplementation -Follow-up in 4 weeks for reassessment      Elevated alkaline phosphatase level    GGT within normal limits recently.  We reviewed today that there are several causes of elevated alkaline phosphatase levels.  He is concerned about this due to a family history of bone malignancy.  -SPEP ordered today -Referred to gastroenterology as otherwise documented.      Iron deficiency    Noted on recent labs.  He is currently on iron supplementation.  Repeat iron studies at follow-up in 4 weeks.      History of colonoscopy - Primary    He previously underwent colonoscopy in 2018 due to a significant family history of malignancy.  He had multiple adenomas removed.  Repeat colonoscopy is recommended in 3 years.  This has not been completed.  I have placed a new referral to  gastroenterology today.      Return in about 4 weeks (around 12/29/2021) for HTN, lab review, fatigue.    Johnette Abraham, MD

## 2021-12-01 NOTE — Assessment & Plan Note (Signed)
He previously underwent colonoscopy in 2018 due to a significant family history of malignancy.  He had multiple adenomas removed.  Repeat colonoscopy is recommended in 3 years.  This has not been completed.  I have placed a new referral to gastroenterology today.

## 2021-12-01 NOTE — Assessment & Plan Note (Signed)
Atorvastatin 10 mg daily was started based on results of the lipid panel from August.  Repeat lipid panel was ordered at his last appointment in October.  His total cholesterol was 167 and LDL 98.  These are both improved, however not at goal. -Increase atorvastatin to 20 mg daily

## 2021-12-04 LAB — B12 AND FOLATE PANEL
Folate: 8.7 ng/mL (ref >3.0)
Vitamin B-12: 309 pg/mL (ref 232–1245)

## 2021-12-04 LAB — CMP14+EGFR
ALT: 24 IU/L (ref 0–44)
AST: 20 IU/L (ref 0–40)
Albumin/Globulin Ratio: 1.6 (ref 1.2–2.2)
Albumin: 4.4 g/dL (ref 3.8–4.9)
Alkaline Phosphatase: 172 IU/L — ABNORMAL HIGH (ref 44–121)
BUN/Creatinine Ratio: 14 (ref 9–20)
BUN: 12 mg/dL (ref 6–24)
Bilirubin Total: 0.3 mg/dL (ref 0.0–1.2)
CO2: 23 mmol/L (ref 20–29)
Calcium: 9.3 mg/dL (ref 8.7–10.2)
Chloride: 104 mmol/L (ref 96–106)
Creatinine, Ser: 0.88 mg/dL (ref 0.76–1.27)
Globulin, Total: 2.7 g/dL (ref 1.5–4.5)
Glucose: 251 mg/dL — ABNORMAL HIGH (ref 70–99)
Potassium: 4.9 mmol/L (ref 3.5–5.2)
Sodium: 141 mmol/L (ref 134–144)
Total Protein: 7.1 g/dL (ref 6.0–8.5)
eGFR: 103 mL/min/{1.73_m2} (ref >59)

## 2021-12-04 LAB — CBC WITH DIFFERENTIAL/PLATELET
Basophils Absolute: 0.1 10*3/uL (ref 0.0–0.2)
Basos: 1 %
EOS (ABSOLUTE): 0.2 10*3/uL (ref 0.0–0.4)
Eos: 2 %
Hematocrit: 49.5 % (ref 37.5–51.0)
Hemoglobin: 15.9 g/dL (ref 13.0–17.7)
Immature Grans (Abs): 0 10*3/uL (ref 0.0–0.1)
Immature Granulocytes: 1 %
Lymphocytes Absolute: 2.1 10*3/uL (ref 0.7–3.1)
Lymphs: 25 %
MCH: 26.7 pg (ref 26.6–33.0)
MCHC: 32.1 g/dL (ref 31.5–35.7)
MCV: 83 fL (ref 79–97)
Monocytes Absolute: 0.8 10*3/uL (ref 0.1–0.9)
Monocytes: 9 %
Neutrophils Absolute: 5.4 10*3/uL (ref 1.4–7.0)
Neutrophils: 62 %
Platelets: 316 10*3/uL (ref 150–450)
RBC: 5.96 x10E6/uL — ABNORMAL HIGH (ref 4.14–5.80)
RDW: 12.7 % (ref 11.6–15.4)
WBC: 8.5 10*3/uL (ref 3.4–10.8)

## 2021-12-04 LAB — PROTEIN ELECTROPHORESIS, SERUM
A/G Ratio: 1.2 (ref 0.7–1.7)
Albumin ELP: 3.8 g/dL (ref 2.9–4.4)
Alpha 1: 0.3 g/dL (ref 0.0–0.4)
Alpha 2: 0.8 g/dL (ref 0.4–1.0)
Beta: 1.1 g/dL (ref 0.7–1.3)
Gamma Globulin: 1.1 g/dL (ref 0.4–1.8)
Globulin, Total: 3.3 g/dL (ref 2.2–3.9)

## 2021-12-09 ENCOUNTER — Encounter: Payer: Self-pay | Admitting: *Deleted

## 2021-12-09 NOTE — Patient Instructions (Signed)
  Procedure: colonoscopy  Estimated body mass index is 29.57 kg/m as calculated from the following:   Height as of this encounter: 6' (1.829 m).   Weight as of this encounter: 218 lb (98.9 kg).   Have you had a colonoscopy before?  08/30/16, Dr. Oneida Alar  Do you have family history of colon cancer?  Yes, father, mother, 2 brothers, 1st cousin  Do you have a family history of polyps? yes  Previous colonoscopy with polyps removed? yes  Do you have a history colorectal cancer?   no  Are you diabetic?  yes  Do you have a prosthetic or mechanical heart valve? No  Do you have a pacemaker/defibrillator?   no  Have you had endocarditis/atrial fibrillation?  no  Do you use supplemental oxygen/CPAP?  no  Have you had joint replacement within the last 12 months?  no  Do you tend to be constipated or have to use laxatives?  yes   Do you have history of alcohol use? If yes, how much and how often.  no  Do you have history or are you using drugs? If yes, what do are you  using?  no  Have you ever had a stroke/heart attack?    Have you ever had a heart or other vascular stent placed,?  Do you take weight loss medication? no  Do you take any blood-thinning medications such as: (Plavix, aspirin, Coumadin, Aggrenox, Brilinta, Xarelto, Eliquis, Pradaxa, Savaysa or Effient)? no  If yes we need the name, milligram, dosage and who is prescribing doctor:               Current Outpatient Medications  Medication Sig Dispense Refill   atorvastatin (LIPITOR) 20 MG tablet Take 1 tablet (20 mg total) by mouth daily. 90 tablet 1   Continuous Blood Gluc Receiver (FREESTYLE LIBRE 14 DAY READER) DEVI Used to check blood sugar continuously DX: E10.319 90 day supply 1 each EACH   Continuous Blood Gluc Sensor (FREESTYLE LIBRE 3 SENSOR) MISC Used to check blood sugar topical every 2 weeks E10.319 90 day supplyUsed to check blood sugar topical every 2 weeks E10.319 90 day supply 6 each 2   insulin degludec  (TRESIBA FLEXTOUCH) 100 UNIT/ML FlexTouch Pen Inject 50 Units into the skin daily. 45 mL 1   insulin glargine-yfgn (SEMGLEE, YFGN,) 100 UNIT/ML Pen Inject 40 Units into the skin daily. 3 mL 5   lisinopril (ZESTRIL) 20 MG tablet Take 1 tablet (20 mg total) by mouth daily. 90 tablet 1   ondansetron (ZOFRAN) 4 MG tablet Take 1 tablet (4 mg total) by mouth every 8 (eight) hours as needed for nausea or vomiting. 20 tablet 0   Tadalafil 2.5 MG TABS Take 1 tablet (2.5 mg total) by mouth daily. 90 tablet 1   No current facility-administered medications for this visit.    No Known Allergies

## 2021-12-13 ENCOUNTER — Telehealth: Payer: Self-pay | Admitting: Internal Medicine

## 2021-12-13 NOTE — Telephone Encounter (Signed)
Patient left a message that he was asked to get in touch with our office.

## 2021-12-13 NOTE — Telephone Encounter (Signed)
Not sure. I didn't call him. His triage has been sent to providers for review

## 2021-12-14 NOTE — Telephone Encounter (Signed)
Pt had left vm stating he needed to schedule his colonoscopy.   Advised pt that his questionnaire had been sent to the providers for review and once they inform us it's ok to schedule him, we will give him a call. Pt verbalized understanding.

## 2021-12-20 ENCOUNTER — Other Ambulatory Visit: Payer: Self-pay

## 2021-12-20 ENCOUNTER — Telehealth: Payer: Self-pay | Admitting: Internal Medicine

## 2021-12-20 DIAGNOSIS — R5383 Other fatigue: Secondary | ICD-10-CM

## 2021-12-20 NOTE — Telephone Encounter (Signed)
Patient called, not feeling well nauseated Saturday and vomiting on Sunday.  He would like his labs to be repeated.  Pt's # [phone]

## 2021-12-21 LAB — CBC WITH DIFFERENTIAL/PLATELET
Basophils Absolute: 0 10*3/uL (ref 0.0–0.2)
Basos: 1 %
EOS (ABSOLUTE): 0.1 10*3/uL (ref 0.0–0.4)
Eos: 1 %
Hematocrit: 46.2 % (ref 37.5–51.0)
Hemoglobin: 15.4 g/dL (ref 13.0–17.7)
Immature Grans (Abs): 0 10*3/uL (ref 0.0–0.1)
Immature Granulocytes: 0 %
Lymphocytes Absolute: 1.8 10*3/uL (ref 0.7–3.1)
Lymphs: 23 %
MCH: 27.4 pg (ref 26.6–33.0)
MCHC: 33.3 g/dL (ref 31.5–35.7)
MCV: 82 fL (ref 79–97)
Monocytes Absolute: 0.6 10*3/uL (ref 0.1–0.9)
Monocytes: 8 %
Neutrophils Absolute: 5.4 10*3/uL (ref 1.4–7.0)
Neutrophils: 67 %
Platelets: 307 10*3/uL (ref 150–450)
RBC: 5.62 x10E6/uL (ref 4.14–5.80)
RDW: 13.2 % (ref 11.6–15.4)
WBC: 8.1 10*3/uL (ref 3.4–10.8)

## 2021-12-21 LAB — CMP14+EGFR
ALT: 28 IU/L (ref 0–44)
AST: 24 IU/L (ref 0–40)
Albumin/Globulin Ratio: 1.7 (ref 1.2–2.2)
Albumin: 4.4 g/dL (ref 3.8–4.9)
Alkaline Phosphatase: 149 IU/L — ABNORMAL HIGH (ref 44–121)
BUN/Creatinine Ratio: 13 (ref 9–20)
BUN: 12 mg/dL (ref 6–24)
Bilirubin Total: 0.4 mg/dL (ref 0.0–1.2)
CO2: 23 mmol/L (ref 20–29)
Calcium: 9.7 mg/dL (ref 8.7–10.2)
Chloride: 104 mmol/L (ref 96–106)
Creatinine, Ser: 0.91 mg/dL (ref 0.76–1.27)
Globulin, Total: 2.6 g/dL (ref 1.5–4.5)
Glucose: 196 mg/dL — ABNORMAL HIGH (ref 70–99)
Potassium: 4.8 mmol/L (ref 3.5–5.2)
Sodium: 141 mmol/L (ref 134–144)
Total Protein: 7 g/dL (ref 6.0–8.5)
eGFR: 100 mL/min/{1.73_m2} (ref >59)

## 2021-12-21 LAB — IRON,TIBC AND FERRITIN PANEL
Ferritin: 103 ng/mL (ref 30–400)
Iron Saturation: 18 % (ref 15–55)
Iron: 56 ug/dL (ref 38–169)
Total Iron Binding Capacity: 312 ug/dL (ref 250–450)
UIBC: 256 ug/dL (ref 111–343)

## 2021-12-30 ENCOUNTER — Encounter: Payer: Self-pay | Admitting: *Deleted

## 2021-12-30 ENCOUNTER — Ambulatory Visit (INDEPENDENT_AMBULATORY_CARE_PROVIDER_SITE_OTHER): Payer: Commercial Managed Care - PPO | Admitting: Internal Medicine

## 2021-12-30 ENCOUNTER — Encounter: Payer: Self-pay | Admitting: Internal Medicine

## 2021-12-30 VITALS — BP 144/76 | HR 76 | Resp 16 | Ht 72.0 in | Wt 222.0 lb

## 2021-12-30 DIAGNOSIS — E785 Hyperlipidemia, unspecified: Secondary | ICD-10-CM

## 2021-12-30 DIAGNOSIS — R5383 Other fatigue: Secondary | ICD-10-CM

## 2021-12-30 DIAGNOSIS — M545 Low back pain, unspecified: Secondary | ICD-10-CM | POA: Diagnosis not present

## 2021-12-30 DIAGNOSIS — I10 Essential (primary) hypertension: Secondary | ICD-10-CM | POA: Diagnosis not present

## 2021-12-30 DIAGNOSIS — E1069 Type 1 diabetes mellitus with other specified complication: Secondary | ICD-10-CM

## 2021-12-30 DIAGNOSIS — Z9189 Other specified personal risk factors, not elsewhere classified: Secondary | ICD-10-CM | POA: Diagnosis not present

## 2021-12-30 DIAGNOSIS — G8929 Other chronic pain: Secondary | ICD-10-CM

## 2021-12-30 MED ORDER — PEG 3350-KCL-NA BICARB-NACL 420 G PO SOLR: 4000.0000 mL | Freq: Once | ORAL | 0 refills | Status: AC

## 2021-12-30 MED ORDER — LISINOPRIL 20 MG PO TABS: 40.0000 mg | ORAL_TABLET | Freq: Every day | ORAL | 1 refills | Status: DC

## 2021-12-30 NOTE — Progress Notes (Signed)
Established Patient Office Visit  Subjective   Patient ID: Sean Buchanan, male    DOB: Sep 12, 1965  Age: 56 y.o. MRN: [id number]  Chief Complaint  Patient presents with   Back Pain    Dull achy pain in lower back and pelvic area for the past 3-4 months @ (08/03/21)   Mr. Sean Buchanan returns to care today.  He was last seen by me on 11/29 at which time he continued to endorse fatigue.  Lisinopril was increased to 20 mg daily and he was referred to gastroenterology for repeat colonoscopy.  Interim he has been scheduled for colonoscopy in 01/11/2022.  He also contacted our office on 12/18 endorsing nausea with vomiting.  He requested repeat labs.  There have otherwise been no acute interval events.  Today Mr. Sean Buchanan states that his symptoms of nausea and vomiting have resolved.  He continues to endorse fatigue.  His symptoms have not worsened or improved.  He also reports chronic low back/pelvic pain.  No red flag symptoms identified.  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   Qualifier: Diagnosis of  By: Rebecca Eaton LPN, Jaime     Hyperlipidemia    Hypertension    IDDM (insulin dependent diabetes mellitus) 1983   AGE 32   Retinopathy, DIABETIC    Stroke ([place]) 04/20/2011   left facial weakness, and left hemiparesis   Past Surgical History:  Procedure Laterality Date   COLONOSCOPY N/A 08/30/2016   Procedure: COLONOSCOPY;  Surgeon: Danie Binder, MD;  [facility];  Service: Endoscopy;  Laterality: N/A;  12:00pm   ESOPHAGOGASTRODUODENOSCOPY N/A 08/30/2016   Procedure:  ESOPHAGOGASTRODUODENOSCOPY (EGD);  Surgeon: Danie Binder, MD;  Location[facility];  Service: Endoscopy;  Laterality: N/A;   EYE SURGERY  2012   laser surgery to both eyes in July 18 and 26 , 2012   KNEE ARTHROSCOPY  1991 approx   Social History   Tobacco Use   Smoking status: Never   Smokeless tobacco: Never  Vaping Use   Vaping Use: Never used  Substance Use Topics   Alcohol use: No   Drug use: No   Family History  Problem Relation Age of Onset   Diabetes Mother    Hyperlipidemia Mother    Hypertension Mother    Rosacea Mother    GER disease Father    Hypertension Father    Colon polyps Father    Colon polyps Sister    Diabetes Sister    Prostate cancer Paternal Uncle    Colon cancer Neg Hx    No Known Allergies  Review of Systems  Constitutional:  Positive for malaise/fatigue.  Musculoskeletal:  Positive for back pain (Chronic lumbar back pain).     Objective:     BP (!) 144/76   Pulse 76   Resp 16   Ht 6' (1.829 m)   Wt 222 lb (100.7 kg)   SpO2 92%   BMI 30.11 kg/m  BP Readings from Last 3 Encounters:  12/30/21 (!) 144/76  12/01/21 (!) 143/77  11/02/21 (!) 158/76   Physical Exam Vitals reviewed.  Constitutional:      General: He is not in acute distress.    Appearance: Normal appearance. He is not ill-appearing.  HENT:     Head: Normocephalic and atraumatic.     Right Ear: External ear normal.     Left Ear: External ear normal.     Nose: Nose normal. No congestion or rhinorrhea.     Mouth/Throat:     Mouth: Mucous membranes are moist.     Pharynx: Oropharynx is clear.  Eyes:     General: No scleral icterus.    Extraocular Movements: Extraocular movements intact.     Conjunctiva/sclera: Conjunctivae normal.     Pupils: Pupils are equal, round, and reactive to light.  Cardiovascular:     Rate and Rhythm: Normal rate and regular rhythm.     Pulses: Normal pulses.     Heart sounds: Normal heart sounds. No murmur heard. Pulmonary:      Effort: Pulmonary effort is normal.     Breath sounds: Normal breath sounds. No wheezing, rhonchi or rales.  Abdominal:     General: Abdomen is flat. Bowel sounds are normal. There is no distension.     Palpations: Abdomen is soft.     Tenderness: There is no abdominal tenderness.  Musculoskeletal:        General: No swelling, tenderness or deformity. Normal range of motion.     Cervical back: Normal range of motion.  Skin:    General: Skin is warm and dry.     Capillary Refill: Capillary refill takes less than 2 seconds.  Neurological:     General: No focal deficit present.     Mental Status: He is alert and oriented to person, place, and time.     Motor: No weakness.  Psychiatric:        Mood and Affect: Mood normal.        Behavior: Behavior normal.        Thought Content: Thought content normal.    Last CBC Lab Results  Component Value Date   WBC 8.1 12/20/2021   HGB 15.4 12/20/2021   HCT 46.2 12/20/2021   MCV 82 12/20/2021   MCH 27.4 12/20/2021   RDW 13.2 12/20/2021   PLT 307 41/74/0814   Last metabolic panel Lab Results  Component Value Date   GLUCOSE 131 (H) 12/30/2021   NA 141 12/30/2021   K 4.6 12/30/2021   CL 104 12/30/2021   CO2 22 12/30/2021   BUN 13 12/30/2021   CREATININE 0.83 12/30/2021   EGFR 104 12/30/2021   CALCIUM 9.3 12/30/2021   PROT 6.9 12/30/2021   ALBUMIN 4.4 12/30/2021   LABGLOB 2.5 12/30/2021   AGRATIO 1.8 12/30/2021   BILITOT 0.7 12/30/2021   ALKPHOS 143 (H) 12/30/2021   AST 18 12/30/2021   ALT 23 12/30/2021   ANIONGAP 10 06/27/2018   Last lipids Lab Results  Component Value Date   CHOL 167 11/02/2021   HDL 55 11/02/2021   LDLCALC 98 11/02/2021   TRIG 76 11/02/2021   CHOLHDL 3.0 11/02/2021   Last hemoglobin A1c Lab Results  Component Value Date   HGBA1C 8.0 (H) 08/03/2021   Last thyroid functions Lab Results  Component Value Date   TSH 2.419 10/15/2009   Last vitamin D Lab Results  Component Value Date   VD25OH  29.3 (L) 11/17/2021   Last vitamin B12 and Folate Lab Results  Component Value Date   VITAMINB12  309 12/01/2021   FOLATE 8.7 12/01/2021      The 10-year ASCVD risk score (Arnett DK, et al., 2019) is: 10.2%    Assessment & Plan:   Problem List Items Addressed This Visit       HTN, goal below 130/80    Lisinopril was recently increased to 20 mg daily for improved HTN control.  His blood pressure remains elevated today, 144/76. -Increase lisinopril to 40 mg daily. -Follow-up in 4 weeks for HTN check      Fatigue    He endorses fatigue again today.  His symptoms have not changed over the last month.  Previous labs have been consistent with iron deficiency.  He is currently on iron supplementation. -Home sleep test ordered today for further workup      Lumbar back pain    He endorses a 28-monthhistory of dull, achy lumbar back pain.  This pain is bilateral in nature.  No specific movements elicit pain.  No red flag symptoms identified. -Lumbar x-rays ordered today -Consider PT pending imaging results      Return in about 4 weeks (around 01/27/2022) for HTN, fatigue, back pain.    PJohnette Abraham MD

## 2021-12-30 NOTE — Progress Notes (Signed)
Overdue for surveillance. Was due in 3 years.   1/2 dose insulin day before, none day of.  ASA 2.

## 2021-12-30 NOTE — Progress Notes (Signed)
Pt has been scheduled for 01/11/22 at 1 pm with Dr.Carver. Instructions sent via MyChart and prep sent to the pharmacy.

## 2021-12-30 NOTE — Patient Instructions (Signed)
It was a pleasure to see you today.  Thank you for giving [place] the opportunity to be involved in your care.  Below is a brief recap of your visit and next steps.  We will plan to see you again in 4 weeks.  Summary Increase lisinopril to 40 mg daily Lumbar spine xrays ordered today Repeat labs ordered I have also ordered a home sleep study to screen for sleep apnea Follow up in 4 weeks

## 2021-12-31 ENCOUNTER — Telehealth: Payer: Self-pay | Admitting: *Deleted

## 2021-12-31 ENCOUNTER — Ambulatory Visit (HOSPITAL_COMMUNITY)
Admission: RE | Admit: 2021-12-31 | Discharge: 2021-12-31 | Disposition: A | Discharge status: Home / Self Care | Payer: Commercial Managed Care - PPO | Source: Ambulatory Visit | Attending: Internal Medicine | Admitting: Internal Medicine

## 2021-12-31 DIAGNOSIS — M545 Low back pain, unspecified: Secondary | ICD-10-CM | POA: Diagnosis present

## 2021-12-31 DIAGNOSIS — G8929 Other chronic pain: Secondary | ICD-10-CM | POA: Insufficient documentation

## 2021-12-31 LAB — CMP14+EGFR
ALT: 23 IU/L (ref 0–44)
AST: 18 IU/L (ref 0–40)
Albumin/Globulin Ratio: 1.8 (ref 1.2–2.2)
Albumin: 4.4 g/dL (ref 3.8–4.9)
Alkaline Phosphatase: 143 IU/L — ABNORMAL HIGH (ref 44–121)
BUN/Creatinine Ratio: 16 (ref 9–20)
BUN: 13 mg/dL (ref 6–24)
Bilirubin Total: 0.7 mg/dL (ref 0.0–1.2)
CO2: 22 mmol/L (ref 20–29)
Calcium: 9.3 mg/dL (ref 8.7–10.2)
Chloride: 104 mmol/L (ref 96–106)
Creatinine, Ser: 0.83 mg/dL (ref 0.76–1.27)
Globulin, Total: 2.5 g/dL (ref 1.5–4.5)
Glucose: 131 mg/dL — ABNORMAL HIGH (ref 70–99)
Potassium: 4.6 mmol/L (ref 3.5–5.2)
Sodium: 141 mmol/L (ref 134–144)
Total Protein: 6.9 g/dL (ref 6.0–8.5)
eGFR: 104 mL/min/{1.73_m2} (ref >59)

## 2021-12-31 LAB — C-REACTIVE PROTEIN: CRP: 5 mg/L (ref 0–10)

## 2021-12-31 LAB — SEDIMENTATION RATE: Sed Rate: 4 mm/hr (ref 0–30)

## 2021-12-31 NOTE — Telephone Encounter (Signed)
[facility] PA: Aerial Case ID: [id number] Aerial Resource [id number] Aerial Case Status: Approved Aerial Case Status Updated On: 12/30/2021 10:19:18 PM

## 2022-01-03 HISTORY — PX: CATARACT EXTRACTION: SUR2

## 2022-01-03 HISTORY — PX: EYE SURGERY: SHX253

## 2022-01-05 DIAGNOSIS — M545 Low back pain, unspecified: Secondary | ICD-10-CM | POA: Insufficient documentation

## 2022-01-05 NOTE — Assessment & Plan Note (Addendum)
He endorses fatigue again today.  His symptoms have not changed over the last month.  Previous labs have been consistent with iron deficiency.  He is currently on iron supplementation. -Home sleep test ordered today for further workup

## 2022-01-05 NOTE — Assessment & Plan Note (Signed)
He endorses a 7-monthhistory of dull, achy lumbar back pain.  This pain is bilateral in nature.  No specific movements elicit pain.  No red flag symptoms identified. -Lumbar x-rays ordered today -Consider PT pending imaging results

## 2022-01-05 NOTE — Assessment & Plan Note (Addendum)
Lisinopril was recently increased to 20 mg daily for improved HTN control.  His blood pressure remains elevated today, 144/76. -Increase lisinopril to 40 mg daily. -Follow-up in 4 weeks for HTN check

## 2022-01-11 ENCOUNTER — Ambulatory Visit (HOSPITAL_COMMUNITY)
Admission: RE | Admit: 2022-01-11 | Discharge: 2022-01-11 | Disposition: A | Discharge status: Home / Self Care | Payer: Commercial Managed Care - PPO | Attending: Internal Medicine | Admitting: Internal Medicine

## 2022-01-11 ENCOUNTER — Encounter (HOSPITAL_COMMUNITY): Payer: Self-pay

## 2022-01-11 ENCOUNTER — Encounter (HOSPITAL_COMMUNITY)
Admission: RE | Disposition: A | Discharge status: Home / Self Care | Payer: Self-pay | Source: Home / Self Care | Attending: Internal Medicine

## 2022-01-11 ENCOUNTER — Ambulatory Visit (HOSPITAL_BASED_OUTPATIENT_CLINIC_OR_DEPARTMENT_OTHER): Payer: Commercial Managed Care - PPO | Admitting: Anesthesiology

## 2022-01-11 ENCOUNTER — Other Ambulatory Visit: Payer: Self-pay

## 2022-01-11 ENCOUNTER — Ambulatory Visit (HOSPITAL_COMMUNITY): Payer: Commercial Managed Care - PPO | Admitting: Anesthesiology

## 2022-01-11 DIAGNOSIS — Z8371 Family history of adenomatous and serrated polyps: Secondary | ICD-10-CM | POA: Insufficient documentation

## 2022-01-11 DIAGNOSIS — K5730 Diverticulosis of large intestine without perforation or abscess without bleeding: Secondary | ICD-10-CM | POA: Diagnosis not present

## 2022-01-11 DIAGNOSIS — D122 Benign neoplasm of ascending colon: Secondary | ICD-10-CM | POA: Diagnosis not present

## 2022-01-11 DIAGNOSIS — E119 Type 2 diabetes mellitus without complications: Secondary | ICD-10-CM | POA: Diagnosis not present

## 2022-01-11 DIAGNOSIS — K635 Polyp of colon: Secondary | ICD-10-CM

## 2022-01-11 DIAGNOSIS — I10 Essential (primary) hypertension: Secondary | ICD-10-CM | POA: Diagnosis not present

## 2022-01-11 DIAGNOSIS — Z800 Family history of malignant neoplasm of digestive organs: Secondary | ICD-10-CM | POA: Insufficient documentation

## 2022-01-11 DIAGNOSIS — Z86010 Personal history of colonic polyps: Secondary | ICD-10-CM | POA: Diagnosis not present

## 2022-01-11 DIAGNOSIS — K648 Other hemorrhoids: Secondary | ICD-10-CM | POA: Insufficient documentation

## 2022-01-11 DIAGNOSIS — K649 Unspecified hemorrhoids: Secondary | ICD-10-CM | POA: Diagnosis not present

## 2022-01-11 DIAGNOSIS — Q438 Other specified congenital malformations of intestine: Secondary | ICD-10-CM | POA: Insufficient documentation

## 2022-01-11 DIAGNOSIS — Z09 Encounter for follow-up examination after completed treatment for conditions other than malignant neoplasm: Secondary | ICD-10-CM | POA: Diagnosis not present

## 2022-01-11 DIAGNOSIS — Z1211 Encounter for screening for malignant neoplasm of colon: Secondary | ICD-10-CM | POA: Insufficient documentation

## 2022-01-11 DIAGNOSIS — D124 Benign neoplasm of descending colon: Secondary | ICD-10-CM | POA: Diagnosis not present

## 2022-01-11 DIAGNOSIS — Z8673 Personal history of transient ischemic attack (TIA), and cerebral infarction without residual deficits: Secondary | ICD-10-CM | POA: Diagnosis not present

## 2022-01-11 HISTORY — PX: POLYPECTOMY: SHX5525

## 2022-01-11 HISTORY — PX: COLONOSCOPY WITH PROPOFOL: SHX5780

## 2022-01-11 LAB — GLUCOSE, CAPILLARY
Glucose-Capillary: 170 mg/dL — ABNORMAL HIGH (ref 70–99)
Glucose-Capillary: 171 mg/dL — ABNORMAL HIGH (ref 70–99)

## 2022-01-11 SURGERY — COLONOSCOPY WITH PROPOFOL
Anesthesia: General

## 2022-01-11 MED ORDER — LIDOCAINE HCL (CARDIAC) PF 100 MG/5ML IV SOSY
PREFILLED_SYRINGE | INTRAVENOUS | Status: DC | PRN
  Administered 2022-01-11: 60 mg via INTRAVENOUS

## 2022-01-11 MED ORDER — PROPOFOL 10 MG/ML IV BOLUS
INTRAVENOUS | Status: DC | PRN
  Administered 2022-01-11: 50 mg via INTRAVENOUS
  Administered 2022-01-11: 20 mg via INTRAVENOUS
  Administered 2022-01-11: 30 mg via INTRAVENOUS
  Administered 2022-01-11: 50 mg via INTRAVENOUS
  Administered 2022-01-11: 100 mg via INTRAVENOUS
  Administered 2022-01-11 (×2): 50 mg via INTRAVENOUS

## 2022-01-11 MED ORDER — LACTATED RINGERS IV SOLN: INTRAVENOUS | Status: DC

## 2022-01-11 MED ORDER — ONDANSETRON HCL 4 MG/2ML IJ SOLN
4.0000 mg | Freq: Once | INTRAMUSCULAR | Status: AC
  Administered 2022-01-11: 4 mg via INTRAVENOUS

## 2022-01-11 NOTE — Transfer of Care (Signed)
Immediate Anesthesia Transfer of Care Note  Patient: Sean Buchanan  Procedure(s) Performed: COLONOSCOPY WITH PROPOFOL POLYPECTOMY  Patient Location: Endoscopy Unit  Anesthesia Type:General  Level of Consciousness: drowsy  Airway & Oxygen Therapy: Patient Spontanous Breathing  Post-op Assessment: Report given to RN and Post -op Vital signs reviewed and stable  Post vital signs: Reviewed and stable  Last Vitals:  Vitals Value Taken Time  BP 91/53 01/11/22 0931  Temp 36.4 C 01/11/22 0931  Pulse 79 01/11/22 0932  Resp 15 01/11/22 0932  SpO2 100 % 01/11/22 0932  Vitals shown include unvalidated device data.  Last Pain:  Vitals:   01/11/22 0931  TempSrc: Axillary  PainSc:       Patients Stated Pain Goal: 8 (58/48/35 0757)  Complications: No notable events documented.

## 2022-01-11 NOTE — Anesthesia Preprocedure Evaluation (Signed)
Anesthesia Evaluation  Patient identified by MRN, date of birth, ID band Patient awake    Reviewed: Allergy & Precautions, H&P , NPO status , Patient's Chart, lab work & pertinent test results, reviewed documented beta blocker date and time   Airway Mallampati: II  TM Distance: >3 FB Neck ROM: full    Dental no notable dental hx.    Pulmonary neg pulmonary ROS   Pulmonary exam normal breath sounds clear to auscultation       Cardiovascular Exercise Tolerance: Good hypertension, negative cardio ROS  Rhythm:regular Rate:Normal     Neuro/Psych   Anxiety     CVA, Residual Symptoms negative neurological ROS  negative psych ROS   GI/Hepatic negative GI ROS, Neg liver ROS,,,  Endo/Other  negative endocrine ROSdiabetes    Renal/GU negative Renal ROS  negative genitourinary   Musculoskeletal   Abdominal   Peds  Hematology negative hematology ROS (+)   Anesthesia Other Findings   Reproductive/Obstetrics negative OB ROS                             Anesthesia Physical Anesthesia Plan  ASA: 3  Anesthesia Plan: General   Post-op Pain Management:    Induction:   PONV Risk Score and Plan: Propofol infusion  Airway Management Planned:   Additional Equipment:   Intra-op Plan:   Post-operative Plan:   Informed Consent: I have reviewed the patients History and Physical, chart, labs and discussed the procedure including the risks, benefits and alternatives for the proposed anesthesia with the patient or authorized representative who has indicated his/her understanding and acceptance.     Dental Advisory Given  Plan Discussed with: CRNA  Anesthesia Plan Comments:        Anesthesia Quick Evaluation

## 2022-01-11 NOTE — Discharge Instructions (Addendum)
  Colonoscopy Discharge Instructions  Read the instructions outlined below and refer to this sheet in the next few weeks. These discharge instructions provide you with general information on caring for yourself after you leave the hospital. Your doctor may also give you specific instructions. While your treatment has been planned according to the most current medical practices available, unavoidable complications occasionally occur.   ACTIVITY You may resume your regular activity, but move at a slower pace for the next 24 hours.  Take frequent rest periods for the next 24 hours.  Walking will help get rid of the air and reduce the bloated feeling in your belly (abdomen).  No driving for 24 hours (because of the medicine (anesthesia) used during the test).   Do not sign any important legal documents or operate any machinery for 24 hours (because of the anesthesia used during the test).  NUTRITION Drink plenty of fluids.  You may resume your normal diet as instructed by your doctor.  Begin with a light meal and progress to your normal diet. Heavy or fried foods are harder to digest and may make you feel sick to your stomach (nauseated).  Avoid alcoholic beverages for 24 hours or as instructed.  MEDICATIONS You may resume your normal medications unless your doctor tells you otherwise.  WHAT YOU CAN EXPECT TODAY Some feelings of bloating in the abdomen.  Passage of more gas than usual.  Spotting of blood in your stool or on the toilet paper.  IF YOU HAD POLYPS REMOVED DURING THE COLONOSCOPY: No aspirin products for 7 days or as instructed.  No alcohol for 7 days or as instructed.  Eat a soft diet for the next 24 hours.  FINDING OUT THE RESULTS OF YOUR TEST Not all test results are available during your visit. If your test results are not back during the visit, make an appointment with your caregiver to find out the results. Do not assume everything is normal if you have not heard from your  caregiver or the medical facility. It is important for you to follow up on all of your test results.  SEEK IMMEDIATE MEDICAL ATTENTION IF: You have more than a spotting of blood in your stool.  Your belly is swollen (abdominal distention).  You are nauseated or vomiting.  You have a temperature over 101.  You have abdominal pain or discomfort that is severe or gets worse throughout the day.   Your colonoscopy revealed 2 polyp(s) which I removed successfully. Await pathology results, my office will contact you. I recommend repeating colonoscopy in 5 years for surveillance purposes.   You also have diverticulosis and internal hemorrhoids. I would recommend increasing fiber in your diet or adding OTC Benefiber/Metamucil. Be sure to drink at least 4 to 6 glasses of water daily. Follow-up with GI as needed.   I hope you have a great rest of your week!  Elon Alas. Abbey Chatters, D.O. Gastroenterology and Hepatology [facility]

## 2022-01-11 NOTE — H&P (Signed)
Primary Care Physician:  Johnette Abraham, MD Primary Gastroenterologist:  Dr. Abbey Chatters  Pre-Procedure History & Physical: HPI:  Sean Buchanan is a 57 y.o. 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, with residual left facial weakness, and left hemiparesis    DISORDER OF BONE AND CARTILAGE UNSPECIFIED 10/14/2009   Qualifier: Diagnosis of  By: Cori Razor LPN, Brandi     ED (erectile dysfunction)    ERECTILE DYSFUNCTION, 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   Qualifier: Diagnosis of  By: Rebecca Eaton LPN, Jaime     Hyperlipidemia    Hypertension    IDDM (insulin dependent diabetes mellitus) 1983   AGE 71   Retinopathy, DIABETIC    Stroke ([place]) 04/20/2011   left facial weakness, and left hemiparesis    Past Surgical History:  Procedure Laterality Date   COLONOSCOPY N/A 08/30/2016   Procedure: COLONOSCOPY;  Surgeon: Danie Binder, MD;  Location: [facility];  Service: Endoscopy;  Laterality: N/A;  12:00pm   ESOPHAGOGASTRODUODENOSCOPY N/A 08/30/2016   Procedure: ESOPHAGOGASTRODUODENOSCOPY (EGD);  Surgeon: Danie Binder, MD;  Location: [facility];  Service: Endoscopy;  Laterality: N/A;   EYE SURGERY  2012   laser surgery to both eyes in July 18 and 26 , 2012   KNEE ARTHROSCOPY  1991 approx    Prior to Admission medications   Medication Sig Start Date End Date Taking? Authorizing Provider  atorvastatin (LIPITOR) 20 MG tablet Take 1 tablet (20 mg total) by mouth daily. 12/01/21 05/30/22 Yes Johnette Abraham, MD  HUMALOG KWIKPEN 100 UNIT/ML KwikPen Inject 1-25 Units into the skin See admin instructions. With all meals and snacks 01/04/22  Yes  [provider]  insulin glargine-yfgn (SEMGLEE, YFGN,) 100 UNIT/ML Pen Inject 40 Units into the skin daily. 11/22/21  Yes Johnette Abraham, MD  lisinopril (ZESTRIL) 20 MG tablet Take 2 tablets (40 mg total) by mouth daily. 12/30/21 06/28/22 Yes Johnette Abraham, MD  Tadalafil 2.5 MG TABS Take 1 tablet (2.5 mg total) by mouth daily. 10/05/21 04/03/22 Yes Johnette Abraham, MD  Continuous Blood Gluc Receiver (FREESTYLE LIBRE 14 DAY READER) DEVI Used to check blood sugar continuously DX: E10.319 90 day supply 08/03/21   P[employer]a, Dewaine Conger, FNP  Continuous Blood Gluc Sensor (FREESTYLE LIBRE 3 SENSOR) MISC Used to check blood sugar topical every 2 weeks E10.319 90 day supplyUsed to check blood sugar topical every 2 weeks E10.319 90 day supply 10/25/21   Johnette Abraham, MD  insulin degludec (TRESIBA FLEXTOUCH) 100 UNIT/ML FlexTouch Pen Inject 50 Units into the skin daily. Patient not taking: Reported on 01/06/2022 08/04/21   Renee Rival, FNP  ondansetron (ZOFRAN) 4 MG tablet Take 1 tablet (4 mg total) by mouth every 8 (eight) hours as needed for nausea or vomiting. 11/02/21   Johnette Abraham, MD    Allergies as of 12/30/2021   (No Known Allergies)    Family History  Problem Relation Age of Onset   Diabetes Mother    Hyperlipidemia Mother    Hypertension Mother    Rosacea Mother    GER disease Father  Hypertension Father    Colon polyps Father    Colon polyps Sister    Diabetes Sister    Prostate cancer Paternal Uncle    Colon cancer Neg Hx     Social History   Socioeconomic History   Marital status: Single    Spouse name: Not on file   Number of children: Not on file   Years of education: Not on file   Highest education level: Not on file  Occupational History   Occupation: EMT   Tobacco Use   Smoking status: Never   Smokeless tobacco: Never  Vaping Use   Vaping Use: Never used  Substance and Sexual Activity   Alcohol use: No   Drug use: No   Sexual activity:  Not Currently  Other Topics Concern   Not on file  Social History Narrative   Lives with mom and dad   Cat 3 outside       Enjoy: tv- hbo max -sci fy      Diet: eats all food groups-- a lot of fast food    Caffeine: soda daily 2 liters or more daily    Water: 2-4 cups       Wears seat belt    Does not use phone while driving   Smoke Camera operator -safe location    Social Determinants of Health   Financial Resource Strain: Low Risk  (11/13/2019)   Overall Financial Resource Strain (CARDIA)    Difficulty of Paying Living Expenses: Not hard at all  Food Insecurity: No Food Insecurity (11/13/2019)   Hunger Vital Sign    Worried About Running Out of Food in the Last Year: Never true    [place] in the Last Year: Never true  Transportation Needs: No Transportation Needs (11/13/2019)   PRAPARE - Hydrologist (Medical): No    Lack of Transportation (Non-Medical): No  Physical Activity: Insufficiently Active (11/13/2019)   Exercise Vital Sign    Days of Exercise per Week: 1 day    Minutes of Exercise per Session: 20 min  Stress: No Stress Concern Present (11/13/2019)   [place]    Feeling of Stress : Only a little  Social Connections: Socially Isolated (11/13/2019)   Social Connection and Isolation Panel [NHANES]    Frequency of Communication with Friends and Family: Three times a week    Frequency of Social Gatherings with Friends and Family: Three times a week    Attends Religious Services: Never    Active Member of Clubs or Organizations: No    Attends Archivist Meetings: Never    Marital Status: Never married  Intimate Partner Violence: Not At Risk (11/13/2019)   Humiliation, Afraid, Rape, and Kick questionnaire    Fear of Current or Ex-Partner: No    Emotionally Abused: No    Physically Abused: No    Sexually Abused: No     Review of Systems: See HPI, otherwise negative ROS  Physical Exam: Vital signs in last 24 hours: Temp:  [97.8 F (36.6 C)] 97.8 F (36.6 C) (01/09 0749) Pulse Rate:  [78] 78 (01/09 0749) Resp:  [13] 13 (01/09 0749) BP: (145)/(75) 145/75 (01/09 0749) SpO2:  [98 %] 98 % (01/09 0749)   General:   Alert,  Well-developed, well-nourished, pleasant and cooperative in NAD Head:  Normocephalic and atraumatic. Eyes:  Sclera clear, no icterus.   Conjunctiva  pink. Ears:  Normal auditory acuity. Nose:  No deformity, discharge,  or lesions. Msk:  Symmetrical without gross deformities. Normal posture. Extremities:  Without clubbing or edema. Neurologic:  Alert and  oriented x4;  grossly normal neurologically. Skin:  Intact without significant lesions or rashes. Psych:  Alert and cooperative. Normal mood and affect.  Impression/Plan: Sean Buchanan is here for a colonoscopy to be performed for surveillance purposes, personal history of adenomatous colon polyps in 2018 and family history of colon cancer in father, mother, 2 brothers, 1st cousin   The risks of the procedure including infection, bleed, or perforation as well as benefits, limitations, alternatives and imponderables have been reviewed with the patient. Questions have been answered. All parties agreeable.

## 2022-01-11 NOTE — Op Note (Signed)
[name] Procedure Date: 01/11/2022 8:47 AM MRN: [id number] Date of Birth: 07-08-1965 Attending MD: Sean Buchanan , [place], [id number] CSN: [id number] Age: 57 Admit Type: Outpatient Procedure:                Colonoscopy Indications:              Surveillance: Personal history of adenomatous                            polyps on last colonoscopy > 5 years ago Providers:                Sean Alas. Abbey Chatters, DO, Sean Buchanan, Sean Buchanan Referring MD:              Medicines:                See the Anesthesia note for documentation of the                            administered medications Complications:            No immediate complications. Estimated Blood Loss:     Estimated blood loss was minimal. Procedure:                Pre-Anesthesia Assessment:                           - The anesthesia plan was to use monitored                            anesthesia care (MAC).                           After obtaining informed consent, the colonoscope                            was passed under direct vision. Throughout the                            procedure, the patient's blood pressure, pulse, and                            oxygen saturations were monitored continuously. The                            [id number]-HQ190L ([id number]) scope was introduced through                            the anus and advanced to the the cecum, identified                            by appendiceal orifice and ileocecal valve. The                            colonoscopy was performed without difficulty. The  patient tolerated the procedure well. The quality                            of the bowel preparation was evaluated using the                            BBPS [facility] Bowel Preparation Scale) with scores                            of: Right Colon = 2 (minor amount of residual                            staining, small fragments of stool and/or opaque                             liquid, but mucosa seen well), Transverse Colon = 2                            (minor amount of residual staining, small fragments                            of stool and/or opaque liquid, but mucosa seen                            well) and Left Colon = 2 (minor amount of residual                            staining, small fragments of stool and/or opaque                            liquid, but mucosa seen well). The total BBPS score                            equals 6. Fair. Scope In: 9:05:17 AM Scope Out: 9:28:30 AM Scope Withdrawal Time: 0 hours 17 minutes 46 seconds  Total Procedure Duration: 0 hours 23 minutes 13 seconds  Findings:      The perianal and digital rectal examinations were normal.      Non-bleeding internal hemorrhoids were found during endoscopy.      Multiple small-mouthed diverticula were found in the sigmoid colon.      A 5 mm polyp was found in the ascending colon. The polyp was sessile.       The polyp was removed with a cold snare. Resection and retrieval were       complete.      A 3 mm polyp was found in the descending colon. The polyp was sessile.       The polyp was removed with a cold snare. Resection and retrieval were       complete.      A tattoo was seen in the descending colon. The tattoo site appeared       normal.      The colon (entire examined portion) was mildly redundant. Advancing the       scope required applying abdominal pressure. Impression:               -  Non-bleeding internal hemorrhoids.                           - Diverticulosis in the sigmoid colon.                           - One 5 mm polyp in the ascending colon, removed                            with a cold snare. Resected and retrieved.                           - One 3 mm polyp in the descending colon, removed                            with a cold snare. Resected and retrieved.                           - A tattoo was seen in the descending colon. The                             tattoo site appeared normal.                           - Redundant colon. Moderate Sedation:      Per Anesthesia Care Recommendation:           - Patient has a contact number available for                            emergencies. The signs and symptoms of potential                            delayed complications were discussed with the                            patient. Return to normal activities tomorrow.                            Written discharge instructions were provided to the                            patient.                           - Resume previous diet.                           - Continue present medications.                           - Await pathology results.                           - Repeat colonoscopy in 5 years for surveillance  and family history of colon cancer.                           - Return to GI clinic PRN. Procedure Code(s):        --- Professional ---                           [phone], Colonoscopy, flexible; with removal of                            tumor(s), polyp(s), or other lesion(s) by snare                            technique Diagnosis Code(s):        --- Professional ---                           Z86.010, Personal history of colonic polyps                           K64.8, Other hemorrhoids                           D12.2, Benign neoplasm of ascending colon                           D12.4, Benign neoplasm of descending colon                           K57.30, Diverticulosis of large intestine without                            perforation or abscess without bleeding                           Q43.8, Other specified congenital malformations of                            intestine CPT copyright 2022 American Medical Association. All rights reserved. The codes documented in this report are preliminary and upon coder review may  be revised to meet current compliance requirements. Sean Alas. Abbey Chatters, DO [place]  Abbey Chatters, DO 01/11/2022 9:32:25 AM This report has been signed electronically. Number of Addenda: 0

## 2022-01-12 LAB — SURGICAL PATHOLOGY

## 2022-01-15 ENCOUNTER — Emergency Department (HOSPITAL_COMMUNITY)
Admission: EM | Admit: 2022-01-15 | Discharge: 2022-01-15 | Disposition: A | Discharge status: Home / Self Care | Payer: Commercial Managed Care - PPO | Attending: Emergency Medicine | Admitting: Emergency Medicine

## 2022-01-15 ENCOUNTER — Other Ambulatory Visit: Payer: Self-pay

## 2022-01-15 ENCOUNTER — Encounter (HOSPITAL_COMMUNITY): Payer: Self-pay

## 2022-01-15 DIAGNOSIS — E162 Hypoglycemia, unspecified: Secondary | ICD-10-CM

## 2022-01-15 DIAGNOSIS — E11649 Type 2 diabetes mellitus with hypoglycemia without coma: Secondary | ICD-10-CM | POA: Insufficient documentation

## 2022-01-15 DIAGNOSIS — Z794 Long term (current) use of insulin: Secondary | ICD-10-CM | POA: Insufficient documentation

## 2022-01-15 LAB — CBC
HCT: 44.2 % (ref 39.0–52.0)
Hemoglobin: 14.3 g/dL (ref 13.0–17.0)
MCH: 27.6 pg (ref 26.0–34.0)
MCHC: 32.4 g/dL (ref 30.0–36.0)
MCV: 85.2 fL (ref 80.0–100.0)
Platelets: 286 10*3/uL (ref 150–400)
RBC: 5.19 MIL/uL (ref 4.22–5.81)
RDW: 13.7 % (ref 11.5–15.5)
WBC: 7.5 10*3/uL (ref 4.0–10.5)
nRBC: 0 % (ref 0.0–0.2)

## 2022-01-15 LAB — COMPREHENSIVE METABOLIC PANEL
ALT: 29 U/L (ref 0–44)
AST: 28 U/L (ref 15–41)
Albumin: 3.7 g/dL (ref 3.5–5.0)
Alkaline Phosphatase: 123 U/L (ref 38–126)
Anion gap: 8 (ref 5–15)
BUN: 13 mg/dL (ref 6–20)
CO2: 24 mmol/L (ref 22–32)
Calcium: 8.8 mg/dL — ABNORMAL LOW (ref 8.9–10.3)
Chloride: 105 mmol/L (ref 98–111)
Creatinine, Ser: 0.72 mg/dL (ref 0.61–1.24)
GFR, Estimated: >60 mL/min (ref >60)
Glucose, Bld: 222 mg/dL — ABNORMAL HIGH (ref 70–99)
Potassium: 4 mmol/L (ref 3.5–5.1)
Sodium: 137 mmol/L (ref 135–145)
Total Bilirubin: 0.5 mg/dL (ref 0.3–1.2)
Total Protein: 6.8 g/dL (ref 6.5–8.1)

## 2022-01-15 LAB — CBG MONITORING, ED
Glucose-Capillary: 172 mg/dL — ABNORMAL HIGH (ref 70–99)
Glucose-Capillary: 244 mg/dL — ABNORMAL HIGH (ref 70–99)

## 2022-01-15 NOTE — ED Triage Notes (Signed)
Pt c/o hypoglycemia, states around 0415 his CBG was reading below 50, EMS states pt passed out and had seizure-like activity. Pt has had 25 of D50, 25 of D10, oral glucose, sandwich, and candy bar since. CBG is reading 171 upon arrival but states it has been fluctuating all morning.

## 2022-01-15 NOTE — ED Provider Notes (Signed)
Signout from Dr. Waverly Ferrari.  57 year old insulin-dependent diabetic here with low blood sugar episode.  He is eaten and drank.  Plan is to follow-up on blood work. Physical Exam  BP (!) 147/67   Pulse 87   Temp 98 F (36.7 C) (Oral)   Resp 20   Ht 6' (1.829 m)   Wt 100.7 kg   SpO2 94%   BMI 30.11 kg/m   Physical Exam  Procedures  Procedures  ED Course / MDM    Medical Decision Making Amount and/or Complexity of Data Reviewed Labs: ordered.   Blood work fairly unremarkable other than elevated blood sugar.  Patient is awake and alert.  He feels back to baseline and is calling for a ride.  He will continue to monitor his blood sugar at home and continue to take and carbohydrates.  Return instructions discussed       Hayden Rasmussen, MD 01/15/22 [id number]

## 2022-01-15 NOTE — Anesthesia Postprocedure Evaluation (Signed)
Anesthesia Post Note  Patient: Sean Buchanan  Procedure(s) Performed: COLONOSCOPY WITH PROPOFOL POLYPECTOMY  Patient location during evaluation: Phase II Anesthesia Type: General Level of consciousness: awake Pain management: pain level controlled Vital Signs Assessment: post-procedure vital signs reviewed and stable Respiratory status: spontaneous breathing and respiratory function stable Cardiovascular status: blood pressure returned to baseline and stable Postop Assessment: no headache and no apparent nausea or vomiting Anesthetic complications: no Comments: Late entry   No notable events documented.   Last Vitals:  Vitals:   01/11/22 0946 01/11/22 1008  BP:  112/76  Pulse: 76 84  Resp: 14 18  Temp:    SpO2: 98% 98%    Last Pain:  Vitals:   01/11/22 0940  TempSrc:   PainSc: 0-No pain                 Louann Sjogren

## 2022-01-15 NOTE — ED Provider Notes (Signed)
[facility] EMERGENCY DEPARTMENT Provider Note   CSN: [id number] Arrival date & time: 01/15/22  0630     History  Chief Complaint  Patient presents with   Hypoglycemia    Sean Buchanan is a 57 y.o. male.  Patient presents to the emerged department for evaluation of hypoglycemia.  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  His insulin changed 1 week ago because of insurance issues.  He also had a colonoscopy several days ago and had not been eating for the prep.  He has not been eating much since the procedure because he still does not feel well secondary to the prep and procedure.  Patient works for [employer].  When his blood sugar dropped initially he was given an amp of D50.  Blood sugar came up to around 150 but then dropped again.  He was given additional D10 and ate a sandwich and candy bar, drank [facility].  Blood sugar has been low normal since.  He did lose consciousness when his blood sugar dropped to its lowest and had some shaking episode that was seizure-like.       Home Medications Prior to Admission medications   Medication Sig Start Date End Date Taking? Authorizing Provider  atorvastatin (LIPITOR) 20 MG tablet Take 1 tablet (20 mg total) by mouth daily. 12/01/21 05/30/22  Johnette Abraham, MD  Continuous Blood Gluc Receiver (FREESTYLE LIBRE 14 DAY READER) DEVI Used to check blood sugar continuously DX: E10.319 90 day supply 08/03/21   Paseda, Dewaine Conger, FNP  Continuous Blood Gluc Sensor (FREESTYLE LIBRE 3 SENSOR) MISC Used to check blood sugar topical every 2 weeks E10.319 90 day supplyUsed to check blood sugar topical every 2 weeks E10.319 90 day supply 10/25/21   Johnette Abraham, MD  HUMALOG KWIKPEN 100 UNIT/ML KwikPen Inject 1-25 Units into the skin See admin instructions. With all meals and snacks 01/04/22   [provider]  insulin degludec (TRESIBA FLEXTOUCH) 100 UNIT/ML FlexTouch Pen Inject 50 Units into the skin  daily. Patient not taking: Reported on 01/06/2022 08/04/21   Renee Rival, FNP  insulin glargine-yfgn (SEMGLEE, YFGN,) 100 UNIT/ML Pen Inject 40 Units into the skin daily. 11/22/21   Johnette Abraham, MD  lisinopril (ZESTRIL) 20 MG tablet Take 2 tablets (40 mg total) by mouth daily. 12/30/21 06/28/22  Johnette Abraham, MD  ondansetron (ZOFRAN) 4 MG tablet Take 1 tablet (4 mg total) by mouth every 8 (eight) hours as needed for nausea or vomiting. 11/02/21   Johnette Abraham, MD  Tadalafil 2.5 MG TABS Take 1 tablet (2.5 mg total) by mouth daily. 10/05/21 04/03/22  Johnette Abraham, MD      Allergies    Patient has no known allergies.    Review of Systems   Review of Systems  Physical Exam Updated Vital Signs BP (!) 147/67   Pulse 87   Temp 98 F (36.7 C) (Oral)   Resp 20   Ht 6' (1.829 m)   Wt 100.7 kg   SpO2 94%   BMI 30.11 kg/m  Physical Exam Vitals and nursing note reviewed.  Constitutional:      General: He is not in acute distress.    Appearance: He is well-developed.  HENT:     Head: Normocephalic and atraumatic.     Mouth/Throat:     Mouth: Mucous membranes are moist.  Eyes:     General: Vision grossly intact. Gaze aligned appropriately.  Extraocular Movements: Extraocular movements intact.     Conjunctiva/sclera: Conjunctivae normal.  Cardiovascular:     Rate and Rhythm: Normal rate and regular rhythm.     Pulses: Normal pulses.     Heart sounds: Normal heart sounds, S1 normal and S2 normal. No murmur heard.    No friction rub. No gallop.  Pulmonary:     Effort: Pulmonary effort is normal. No respiratory distress.     Breath sounds: Normal breath sounds.  Abdominal:     Palpations: Abdomen is soft.     Tenderness: There is no abdominal tenderness. There is no guarding or rebound.     Hernia: No hernia is present.  Musculoskeletal:        General: No swelling.     Cervical back: Full passive range of motion without pain, normal range of motion and neck  supple. No pain with movement, spinous process tenderness or muscular tenderness. Normal range of motion.     Right lower leg: No edema.     Left lower leg: No edema.  Skin:    General: Skin is warm and dry.     Capillary Refill: Capillary refill takes less than 2 seconds.     Findings: No ecchymosis, erythema, lesion or wound.  Neurological:     Mental Status: He is alert and oriented to person, place, and time.     GCS: GCS eye subscore is 4. GCS verbal subscore is 5. GCS motor subscore is 6.     Cranial Nerves: Cranial nerves 2-12 are intact.     Sensory: Sensation is intact.     Motor: Motor function is intact. No weakness or abnormal muscle tone.     Coordination: Coordination is intact.  Psychiatric:        Mood and Affect: Mood normal.        Speech: Speech normal.        Behavior: Behavior normal.     ED Results / Procedures / Treatments   Labs (all labs ordered are listed, but only abnormal results are displayed) Labs Reviewed  CBG MONITORING, ED - Abnormal; Notable for the following components:      Result Value   Glucose-Capillary 172 (*)    All other components within normal limits  CBC  COMPREHENSIVE METABOLIC PANEL  CBG MONITORING, ED    EKG None  Radiology No results found.  Procedures Procedures    Medications Ordered in ED Medications - No data to display  ED Course/ Medical Decision Making/ A&P                             Medical Decision Making Amount and/or Complexity of Data Reviewed Labs: ordered.   Patient presents for hypoglycemic episodes.  Etiology is likely multifactorial.  Patient was n.p.o. for 12 hours for colonoscopy and has not been eating much in the couple of days since the procedure.  Additionally, he had his insulin changed because of insurance.  He has not had any illness.  Patient's blood sugar was 72 at arrival.  He will need monitoring for a couple of more hours to make sure he does not drop again.  If his sugars are  stabilized he can be discharged and follow-up with his endocrinologist.  Patient will be signed out to oncoming ER physician to follow scheduled Accu-Cheks.        Final Clinical Impression(s) / ED Diagnoses Final diagnoses:  Hypoglycemia    Rx /  DC Orders ED Discharge Orders     None         Glendola Friedhoff, Gwenyth Allegra, MD 01/15/22 [phone]

## 2022-01-17 ENCOUNTER — Encounter (HOSPITAL_COMMUNITY): Payer: Self-pay | Admitting: Internal Medicine

## 2022-01-17 ENCOUNTER — Telehealth: Payer: Self-pay | Admitting: Internal Medicine

## 2022-01-17 NOTE — Telephone Encounter (Signed)
Transition Care Management Follow-up Telephone Call Date of discharge and from where: 1/13 [facility] Hypoglycemia episode How have you been since you were released from the hospital? Much better  Any questions or concerns? No  Items Reviewed: Did the pt receive and understand the discharge instructions provided? Yes  Medications obtained and verified? Yes  Other? No  Any new allergies since your discharge? No  Dietary orders reviewed? Yes Do you have support at home? Yes   Home Care and Equipment/Supplies: Were home health services ordered? not applicable If so, what is the name of the agency? na  Has the agency set up a time to come to the patient's home? not applicable Were any new equipment or medical supplies ordered?  No What is the name of the medical supply agency? na Were you able to get the supplies/equipment? not applicable Do you have any questions related to the use of the equipment or supplies? No  Functional Questionnaire: (I = Independent and D = Dependent) ADLs: i  Bathing/Dressing- i  Meal Prep- i  Eating- i  Maintaining continence- i  Transferring/Ambulation- i  Managing Meds- i  Follow up appointments reviewed:  PCP Hospital f/u appt confirmed? Yes  Scheduled to see Doren Custard 01/27/22 at 8:20am [facility] f/u appt confirmed? No  Are transportation arrangements needed? No  If their condition worsens, is the pt aware to call PCP or go to the Emergency Dept.? Yes Was the patient provided with contact information for the PCP's office or ED? Yes Was to pt encouraged to call back with questions or concerns? Yes

## 2022-01-18 ENCOUNTER — Other Ambulatory Visit: Payer: Self-pay | Admitting: Internal Medicine

## 2022-01-18 DIAGNOSIS — R110 Nausea: Secondary | ICD-10-CM

## 2022-01-27 ENCOUNTER — Encounter: Payer: Self-pay | Admitting: Internal Medicine

## 2022-01-27 ENCOUNTER — Ambulatory Visit (INDEPENDENT_AMBULATORY_CARE_PROVIDER_SITE_OTHER): Payer: Commercial Managed Care - PPO | Admitting: Internal Medicine

## 2022-01-27 VITALS — BP 127/74 | HR 75 | Ht 72.0 in | Wt 222.6 lb

## 2022-01-27 DIAGNOSIS — M545 Low back pain, unspecified: Secondary | ICD-10-CM | POA: Diagnosis not present

## 2022-01-27 DIAGNOSIS — I10 Essential (primary) hypertension: Secondary | ICD-10-CM | POA: Diagnosis not present

## 2022-01-27 DIAGNOSIS — Z114 Encounter for screening for human immunodeficiency virus [HIV]: Secondary | ICD-10-CM | POA: Diagnosis not present

## 2022-01-27 DIAGNOSIS — E103553 Type 1 diabetes mellitus with stable proliferative diabetic retinopathy, bilateral: Secondary | ICD-10-CM

## 2022-01-27 DIAGNOSIS — R5383 Other fatigue: Secondary | ICD-10-CM | POA: Diagnosis not present

## 2022-01-27 NOTE — Patient Instructions (Signed)
It was a pleasure to see you today.  Thank you for giving [place] the opportunity to be involved in your care.  Below is a brief recap of your visit and next steps.  We will plan to see you again in 6 months.  Summary No medication changes today We complete HIV screening Plan for follow up in 6 months

## 2022-01-27 NOTE — Assessment & Plan Note (Signed)
A1c 8.0 in August.  His current insulin regimen includes Tresiba 40-50 units daily depending on his blood sugar before 15-20 units with meals.  He recently presented to [facility] ED due to a hypoglycemic episode.  This had not happened previously and has not happened since 1/13. -No changes today -Repeat A1c at his next follow-up appointment

## 2022-01-27 NOTE — Assessment & Plan Note (Signed)
HIV screening has been ordered today at the patient's request.

## 2022-01-27 NOTE — Assessment & Plan Note (Signed)
Lumbar x-rays obtained last month demonstrated mild degenerative changes but were otherwise unremarkable.  His symptoms are unchanged today.  No red flag symptoms identified. -Recommended continued home PT

## 2022-01-27 NOTE — Assessment & Plan Note (Signed)
Lisinopril was increased to 40 mg daily at his last appointment for improved HTN control.  His blood pressure today is 127/74. -No additional medication changes today.

## 2022-01-27 NOTE — Assessment & Plan Note (Signed)
Symptoms are unchanged today.  No underlying metabolic etiology is identified.  A home sleep study was previously ordered to screen for OSA, however he has deferred this workup for now as his out-of-pocket cost would have been $400.  He is not interested in further workup at this time.

## 2022-01-27 NOTE — Progress Notes (Signed)
Established Patient Office Visit  Subjective   Patient ID: Sean Buchanan, male    DOB: 1965/06/09  Age: 57 y.o. MRN: [id number]  Chief Complaint  Patient presents with   Hypertension    Follow up   Sean Buchanan returns to care today.  He was last seen by me on 12/30/21 at which time he continued to endorse fatigue and lumbar back pain.  Lisinopril was increased to 40 mg daily for improved treatment of hypertension.  A home sleep test was also ordered for further workup of fatigue.  Lumbar x-rays were ordered to evaluate his back pain.  In the interim he underwent a colonoscopy on 1/9.  2 sessile polyps were removed.  Pathology showed that these were consistent with tubular adenomas.  Repeat colonoscopy in 5 years is recommended.  He also presented to the emergency department 1/13 for an episode of hypoglycemia.  Sean Buchanan reports feeling well today.  He continues to endorse low back pain states that his symptoms have not worsened.  He requests HIV screening today as it has been more than 20 years and also reports that he did not complete the previously ordered home sleep study because his cost after insurance was greater than $400.  He is not interested in additional workup for OSA or fatigue at this time.  He has not had any additional hypoglycemic events since 1/13.   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   Qualifier: Diagnosis of  By: Rebecca Eaton LPN, Jaime     Hyperlipidemia    Hypertension    IDDM (insulin dependent diabetes mellitus) 1983   AGE 82   Retinopathy, DIABETIC    Stroke  ([place]) 04/20/2011   left facial weakness, and left hemiparesis   Past Surgical History:  Procedure Laterality Date   COLONOSCOPY N/A 08/30/2016   Procedure: COLONOSCOPY;  Surgeon: Danie Binder, MD;  Location[facility];  Service: Endoscopy;  Laterality: N/A;  12:00pm   COLONOSCOPY WITH PROPOFOL N/A 01/11/2022   Procedure: COLONOSCOPY WITH PROPOFOL;  Surgeon: Eloise Harman, DO;  Location: [facility];  Service: Endoscopy;  Laterality: N/A;  1:00 pm, pt knows to arrive at 7:30   ESOPHAGOGASTRODUODENOSCOPY N/A 08/30/2016   Procedure: ESOPHAGOGASTRODUODENOSCOPY (EGD);  Surgeon: Danie Binder, MD;  Location: [facility];  Service: Endoscopy;  Laterality: N/A;   EYE SURGERY  2012   laser surgery to both eyes in July 18 and 26 , 2012   KNEE ARTHROSCOPY  1991 approx   POLYPECTOMY  01/11/2022   Procedure: POLYPECTOMY;  Surgeon: Eloise Harman, DO;  Location: [facility];  Service: Endoscopy;;   Social History   Tobacco Use   Smoking status: Never   Smokeless tobacco: Never  Vaping Use   Vaping Use: Never used  Substance Use Topics   Alcohol use: No   Drug use: No   Family History  Problem Relation Age of Onset   Diabetes Mother    Hyperlipidemia Mother    Hypertension Mother    Rosacea Mother  GER disease Father    Hypertension Father    Colon polyps Father    Colon polyps Sister    Diabetes Sister    Prostate cancer Paternal Uncle    Colon cancer Neg Hx    No Known Allergies  Review of Systems  Constitutional:  Positive for malaise/fatigue.  Musculoskeletal:  Positive for back pain (Chronic low back pain).  All other systems reviewed and are negative.    Objective:     BP 127/74   Pulse 75   Ht 6' (1.829 m)   Wt 222 lb 9.6 oz (101 kg)   SpO2 97%   BMI 30.19 kg/m  BP Readings from Last 3 Encounters:  01/27/22 127/74  01/15/22 (!) 151/79  01/11/22 112/76   Physical Exam Vitals reviewed.  Constitutional:      General: He is not in acute  distress.    Appearance: Normal appearance. He is not ill-appearing.  HENT:     Head: Normocephalic and atraumatic.     Right Ear: External ear normal.     Left Ear: External ear normal.     Nose: Nose normal. No congestion or rhinorrhea.     Mouth/Throat:     Mouth: Mucous membranes are moist.     Pharynx: Oropharynx is clear.  Eyes:     General: No scleral icterus.    Extraocular Movements: Extraocular movements intact.     Conjunctiva/sclera: Conjunctivae normal.     Pupils: Pupils are equal, round, and reactive to light.  Cardiovascular:     Rate and Rhythm: Normal rate and regular rhythm.     Pulses: Normal pulses.     Heart sounds: Normal heart sounds. No murmur heard. Pulmonary:     Effort: Pulmonary effort is normal.     Breath sounds: Normal breath sounds. No wheezing, rhonchi or rales.  Abdominal:     General: Abdomen is flat. Bowel sounds are normal. There is no distension.     Palpations: Abdomen is soft.     Tenderness: There is no abdominal tenderness.  Musculoskeletal:        General: No swelling, tenderness or deformity. Normal range of motion.     Cervical back: Normal range of motion.  Skin:    General: Skin is warm and dry.     Capillary Refill: Capillary refill takes less than 2 seconds.  Neurological:     General: No focal deficit present.     Mental Status: He is alert and oriented to person, place, and time.     Motor: No weakness.  Psychiatric:        Mood and Affect: Mood normal.        Behavior: Behavior normal.        Thought Content: Thought content normal.   Last CBC Lab Results  Component Value Date   WBC 7.5 01/15/2022   HGB 14.3 01/15/2022   HCT 44.2 01/15/2022   MCV 85.2 01/15/2022   MCH 27.6 01/15/2022   RDW 13.7 01/15/2022   PLT 286 32/99/2426   Last metabolic panel Lab Results  Component Value Date   GLUCOSE 222 (H) 01/15/2022   NA 137 01/15/2022   K 4.0 01/15/2022   CL 105 01/15/2022   CO2 24 01/15/2022   BUN 13  01/15/2022   CREATININE 0.72 01/15/2022   GFRNONAA >60 01/15/2022   CALCIUM 8.8 (L) 01/15/2022   PROT 6.8 01/15/2022   ALBUMIN 3.7 01/15/2022   LABGLOB 2.5 12/30/2021   AGRATIO 1.8 12/30/2021  BILITOT 0.5 01/15/2022   ALKPHOS 123 01/15/2022   AST 28 01/15/2022   ALT 29 01/15/2022   ANIONGAP 8 01/15/2022   Last lipids Lab Results  Component Value Date   CHOL 167 11/02/2021   HDL 55 11/02/2021   LDLCALC 98 11/02/2021   TRIG 76 11/02/2021   CHOLHDL 3.0 11/02/2021   Last hemoglobin A1c Lab Results  Component Value Date   HGBA1C 8.0 (H) 08/03/2021   Last thyroid functions Lab Results  Component Value Date   TSH 2.419 10/15/2009   Last vitamin D Lab Results  Component Value Date   VD25OH 29.3 (L) 11/17/2021   Last vitamin B12 and Folate Lab Results  Component Value Date   VITAMINB12 309 12/01/2021   FOLATE 8.7 12/01/2021   The 10-year ASCVD risk score (Arnett DK, et al., 2019) is: 8.2%    Assessment & Plan:   Problem List Items Addressed This Visit     HTN, goal below 130/80    Lisinopril was increased to 40 mg daily at his last appointment for improved HTN control.  His blood pressure today is 127/74. -No additional medication changes today.      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  His current insulin regimen includes Tresiba 40-50 units daily depending on his blood sugar before 15-20 units with meals.  He recently presented to [facility] ED due to a hypoglycemic episode.  This had not happened previously and has not happened since 1/13. -No changes today -Repeat A1c at his next follow-up appointment      Fatigue - Primary    Symptoms are unchanged today.  No underlying metabolic etiology is identified.  A home sleep study was previously ordered to screen for OSA, however he has deferred this workup for now as his out-of-pocket cost would have been $400.  He is not interested in further workup at this time.       Lumbar back pain    Lumbar x-rays obtained last month demonstrated mild degenerative changes but were otherwise unremarkable.  His symptoms are unchanged today.  No red flag symptoms identified. -Recommended continued home PT      Screening for HIV (human immunodeficiency virus)    HIV screening has been ordered today at the patient's request.       Return in about 6 months (around 07/28/2022).    Johnette Abraham, MD

## 2022-01-28 LAB — HIV ANTIBODY (ROUTINE TESTING W REFLEX): HIV Screen 4th Generation wRfx: NONREACTIVE

## 2022-02-01 ENCOUNTER — Other Ambulatory Visit: Payer: Self-pay | Admitting: Nurse Practitioner

## 2022-02-25 ENCOUNTER — Other Ambulatory Visit: Payer: Self-pay | Admitting: Internal Medicine

## 2022-02-25 DIAGNOSIS — E1169 Type 2 diabetes mellitus with other specified complication: Secondary | ICD-10-CM

## 2022-04-11 ENCOUNTER — Other Ambulatory Visit: Payer: Self-pay

## 2022-04-11 ENCOUNTER — Telehealth: Payer: Self-pay | Admitting: Internal Medicine

## 2022-04-11 DIAGNOSIS — E103553 Type 1 diabetes mellitus with stable proliferative diabetic retinopathy, bilateral: Secondary | ICD-10-CM

## 2022-04-11 MED ORDER — FREESTYLE LIBRE 14 DAY READER DEVI: Status: DC

## 2022-04-11 MED ORDER — FREESTYLE LIBRE 14 DAY READER DEVI: 1.0000 | Status: DC

## 2022-04-11 MED ORDER — FREESTYLE LIBRE 3 SENSOR MISC: 2 refills | Status: DC

## 2022-04-11 NOTE — Telephone Encounter (Signed)
New message    1. Which medications need to be refilled? (please list name of each medication and dose if known) Dexcom G7   2. Which pharmacy/location (including street and city if local pharmacy) is medication to be sent to?[facility] on [address]  3. Do they need a 30 day or 90 day supply? 90 day supply

## 2022-04-11 NOTE — Addendum Note (Signed)
Addended by: Telford Nab on: 04/11/2022 03:07 PM   Modules accepted: Orders

## 2022-04-11 NOTE — Telephone Encounter (Signed)
Refills sent

## 2022-04-12 ENCOUNTER — Telehealth: Payer: Self-pay | Admitting: Internal Medicine

## 2022-04-12 ENCOUNTER — Other Ambulatory Visit: Payer: Self-pay

## 2022-04-12 DIAGNOSIS — E103553 Type 1 diabetes mellitus with stable proliferative diabetic retinopathy, bilateral: Secondary | ICD-10-CM

## 2022-04-12 MED ORDER — DEXCOM G7 SENSOR MISC: 12 refills | Status: DC

## 2022-04-12 MED ORDER — DEXCOM G7 RECEIVER DEVI: 1 refills | Status: AC

## 2022-04-12 NOTE — Telephone Encounter (Signed)
Patient wants to try a dexcom so placed orders for dexcom.

## 2022-04-12 NOTE — Telephone Encounter (Signed)
Please call patient about wrong med called in

## 2022-04-12 NOTE — Telephone Encounter (Signed)
Please call pt about wrong RX called in  [phone]

## 2022-04-22 ENCOUNTER — Telehealth: Payer: Self-pay | Admitting: Internal Medicine

## 2022-04-22 ENCOUNTER — Other Ambulatory Visit: Payer: Self-pay

## 2022-04-22 MED ORDER — HUMALOG KWIKPEN 100 UNIT/ML ~~LOC~~ SOPN: 1.0000 [IU] | PEN_INJECTOR | SUBCUTANEOUS | 3 refills | Status: DC

## 2022-04-22 NOTE — Telephone Encounter (Signed)
Prescription Request  04/22/2022  LOV: 01/27/2022  What is the name of the medication or equipment? HUMALOG KWIKPEN 100 UNIT/ML KwikPen   Have you contacted your pharmacy to request a refill? No   Which pharmacy would you like this sent to?  [id number] - [place], [place] - [address] AT [address] OF [address] [place] [address] Phone: [phone] Fax: [phone]  [id number], [place] - [address] AT [facility] [address] [facility] [place] [address] Phone: [phone] Fax: [phone]  [facility] [address], [place] - [address] [place] [address] [place] [address] [place] [address] Phone: [phone] Fax: [phone]    Patient notified that their request is being sent to the clinical staff for review and that they should receive a response within 2 business days.   Please advise at Mobile [phone] (mobile)

## 2022-04-22 NOTE — Telephone Encounter (Signed)
Refill sent.

## 2022-06-10 ENCOUNTER — Other Ambulatory Visit: Payer: Self-pay | Admitting: Internal Medicine

## 2022-06-13 ENCOUNTER — Other Ambulatory Visit: Payer: Self-pay | Admitting: Internal Medicine

## 2022-06-13 DIAGNOSIS — I10 Essential (primary) hypertension: Secondary | ICD-10-CM

## 2022-07-14 ENCOUNTER — Other Ambulatory Visit: Payer: Self-pay | Admitting: Internal Medicine

## 2022-07-14 DIAGNOSIS — E1169 Type 2 diabetes mellitus with other specified complication: Secondary | ICD-10-CM

## 2022-07-14 DIAGNOSIS — E1069 Type 1 diabetes mellitus with other specified complication: Secondary | ICD-10-CM

## 2022-07-29 ENCOUNTER — Encounter: Payer: Self-pay | Admitting: Internal Medicine

## 2022-07-29 ENCOUNTER — Ambulatory Visit (INDEPENDENT_AMBULATORY_CARE_PROVIDER_SITE_OTHER): Payer: Commercial Managed Care - PPO | Admitting: Internal Medicine

## 2022-07-29 VITALS — BP 114/64 | HR 73 | Ht 72.0 in | Wt 221.2 lb

## 2022-07-29 DIAGNOSIS — E785 Hyperlipidemia, unspecified: Secondary | ICD-10-CM

## 2022-07-29 DIAGNOSIS — E1069 Type 1 diabetes mellitus with other specified complication: Secondary | ICD-10-CM

## 2022-07-29 DIAGNOSIS — E103553 Type 1 diabetes mellitus with stable proliferative diabetic retinopathy, bilateral: Secondary | ICD-10-CM

## 2022-07-29 DIAGNOSIS — I10 Essential (primary) hypertension: Secondary | ICD-10-CM

## 2022-07-29 DIAGNOSIS — N529 Male erectile dysfunction, unspecified: Secondary | ICD-10-CM | POA: Diagnosis not present

## 2022-07-29 MED ORDER — TADALAFIL 20 MG PO TABS: 10.0000 mg | ORAL_TABLET | ORAL | 11 refills | Status: AC | PRN

## 2022-07-29 NOTE — Assessment & Plan Note (Addendum)
POC A1c has improved to 7.4 from 8.0 previously.  His current insulin regimen includes Semglee 40 units every morning and Humalog sliding scale with meals.  He he has focused on lifestyle modifications aimed at improving his blood sugar as well. -He was congratulated on his progress today.  No medication changes have been made. -Diabetes related preventative care items are up-to-date

## 2022-07-29 NOTE — Assessment & Plan Note (Signed)
BP remains well-controlled with lisinopril.  No medication changes are indicated today.

## 2022-07-29 NOTE — Assessment & Plan Note (Signed)
Secondary to diabetes.  He continues to endorse difficulty both achieving and maintaining erections.  Currently prescribed tadalafil 2.5 mg daily but states that it is ineffective.  He has been doubling the dose without significant improvement. -Increase tadalafil to 10 mg as needed

## 2022-07-29 NOTE — Progress Notes (Signed)
Established Patient Office Visit  Subjective   Patient ID: Sean Buchanan, male    DOB: 1965-05-03  Age: 57 y.o. MRN: [id number]  Chief Complaint  Patient presents with   Diabetes    Follow up   Sean Buchanan returns to care today for follow-up.  He was last evaluated by me on 1/25.  No medication changes were made at that time and 40-month follow-up was arranged.  There have been no acute interval events.  Sean Buchanan reports feeling well today.  He is asymptomatic and has no acute concerns to discuss.  He reports that he recently underwent surgery on his right eye and is recovering nicely.  His vision has improved.  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   Qualifier: Diagnosis of  By: Doreene Nest LPN, Jaime     Hyperlipidemia    Hypertension    IDDM (insulin dependent diabetes mellitus) 1983   AGE 81   Retinopathy, DIABETIC    Stroke (HCC) 04/20/2011   left facial weakness, and left hemiparesis   Past Surgical History:  Procedure Laterality Date   CATARACT EXTRACTION  2024   COLONOSCOPY N/A 08/30/2016   Procedure: COLONOSCOPY;  Surgeon: West Bali, MD;  [facility]: [facility];  Service: Endoscopy;  Laterality: N/A;  12:00pm   COLONOSCOPY WITH PROPOFOL N/A 01/11/2022   Procedure: COLONOSCOPY WITH PROPOFOL;  Surgeon: Lanelle Bal, DO;  Location: [facility];  Service: Endoscopy;  Laterality: N/A;  1:00 pm, pt knows to arrive at 7:30   ESOPHAGOGASTRODUODENOSCOPY N/A 08/30/2016   Procedure: ESOPHAGOGASTRODUODENOSCOPY (EGD);  Surgeon: West Bali, MD;  [facility];  Service: Endoscopy;  Laterality: N/A;   EYE SURGERY  01/03/2010   laser surgery to both eyes in July 18 and 26 , 2012   EYE SURGERY  2024   KNEE ARTHROSCOPY  1991 approx   POLYPECTOMY  01/11/2022   Procedure: POLYPECTOMY;  Surgeon: Lanelle Bal, DO;  Location: [facility];  Service: Endoscopy;;   Social History   Tobacco Use   Smoking status: Never   Smokeless tobacco: Never  Vaping Use   Vaping status: Never Used  Substance Use Topics   Alcohol use: No   Drug use: No   Family History  Problem Relation Age of Onset   Diabetes Mother    Hyperlipidemia Mother    Hypertension Mother    Rosacea Mother    GER disease Father    Hypertension Father    Colon polyps Father    Colon polyps Sister    Diabetes Sister    Prostate cancer Paternal Uncle    Colon cancer Neg Hx    No Known Allergies  Review of Systems  Constitutional:  Negative for chills and fever.  HENT:  Negative for sore throat.   Respiratory:  Negative for cough and shortness of breath.   Cardiovascular:  Negative for chest pain, palpitations and leg swelling.  Gastrointestinal:  Negative for abdominal pain, blood in stool, constipation, diarrhea, nausea and vomiting.  Genitourinary:  Negative for dysuria and hematuria.  Musculoskeletal:  Negative for myalgias.  Skin:  Negative for itching and rash.  Neurological:  Negative for dizziness and headaches.  Psychiatric/Behavioral:  Negative for depression and suicidal ideas.      Objective:     BP 114/64   Pulse 73   Ht 6' (1.829 m)   Wt 221 lb 3.2 oz (100.3 kg)   SpO2 94%   BMI 30.00 kg/m  BP Readings from Last 3 Encounters:  07/29/22 114/64  01/27/22 127/74  01/15/22 (!) 151/79   Physical Exam Vitals reviewed.  Constitutional:      General: He is not in acute distress.    Appearance: Normal appearance. He is not ill-appearing.  HENT:     Head: Normocephalic and atraumatic.     Right Ear: External ear normal.     Left Ear:  External ear normal.     Nose: Nose normal. No congestion or rhinorrhea.     Mouth/Throat:     Mouth: Mucous membranes are moist.     Pharynx: Oropharynx is clear.  Eyes:     General: No scleral icterus.    Extraocular Movements: Extraocular movements intact.     Conjunctiva/sclera: Conjunctivae normal.     Pupils: Pupils are equal, round, and reactive to light.  Cardiovascular:     Rate and Rhythm: Normal rate and regular rhythm.     Pulses: Normal pulses.     Heart sounds: Normal heart sounds. No murmur heard. Pulmonary:     Effort: Pulmonary effort is normal.     Breath sounds: Normal breath sounds. No wheezing, rhonchi or rales.  Abdominal:     General: Abdomen is flat. Bowel sounds are normal. There is no distension.     Palpations: Abdomen is soft.     Tenderness: There is no abdominal tenderness.  Musculoskeletal:        General: No swelling, tenderness or deformity. Normal range of motion.     Cervical back: Normal range of motion.  Skin:    General: Skin is warm and dry.     Capillary Refill: Capillary refill takes less than 2 seconds.  Neurological:     General: No focal deficit present.     Mental Status: He is alert and oriented to person, place, and time.     Motor: No weakness.  Psychiatric:        Mood and Affect: Mood normal.        Behavior: Behavior normal.        Thought Content: Thought content normal.   Last CBC Lab Results  Component Value Date   WBC 7.5 01/15/2022   HGB 14.3 01/15/2022   HCT 44.2 01/15/2022   MCV 85.2 01/15/2022   MCH 27.6 01/15/2022   RDW 13.7 01/15/2022   PLT 286 01/15/2022   Last metabolic panel Lab Results  Component Value Date   GLUCOSE 222 (H) 01/15/2022   NA 137 01/15/2022   K 4.0 01/15/2022   CL 105 01/15/2022   CO2 24 01/15/2022   BUN 13 01/15/2022   CREATININE 0.72 01/15/2022   GFRNONAA >60 01/15/2022   CALCIUM 8.8 (L) 01/15/2022   PROT 6.8 01/15/2022   ALBUMIN 3.7 01/15/2022   LABGLOB 2.5 12/30/2021    AGRATIO 1.8 12/30/2021   BILITOT 0.5 01/15/2022   ALKPHOS 123 01/15/2022   AST 28 01/15/2022   ALT 29 01/15/2022   ANIONGAP 8 01/15/2022   Last lipids Lab Results  Component Value Date  CHOL 167 11/02/2021   HDL 55 11/02/2021   LDLCALC 98 11/02/2021   TRIG 76 11/02/2021   CHOLHDL 3.0 11/02/2021   Last hemoglobin A1c Lab Results  Component Value Date   HGBA1C 8.0 (H) 08/03/2021   Last thyroid functions Lab Results  Component Value Date   TSH 2.419 10/15/2009   Last vitamin D Lab Results  Component Value Date   VD25OH 29.3 (L) 11/17/2021   Last vitamin B12 and Folate Lab Results  Component Value Date   VITAMINB12 309 12/01/2021   FOLATE 8.7 12/01/2021     Assessment & Plan:   Problem List Items Addressed This Visit       HTN, goal below 130/80    BP remains well-controlled with lisinopril.  No medication changes are indicated today.      Hyperlipidemia due to type 1 diabetes mellitus (HCC)    Currently prescribed atorvastatin 20 mg daily.  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  His current insulin regimen includes Semglee 40 units every morning and Humalog sliding scale with meals.  He he has focused on lifestyle modifications aimed at improving his blood sugar as well. -He was congratulated on his progress today.  No medication changes have been made. -Diabetes related preventative care items are up-to-date      Erectile dysfunction - Primary    Secondary to diabetes.  He continues to endorse difficulty both achieving and maintaining erections.  Currently prescribed tadalafil 2.5 mg daily but states that it is ineffective.  He has been doubling the dose without significant improvement. -Increase tadalafil to 10 mg as needed       Return in about 3 months (around 10/29/2022) for CPE.    Billie Lade, MD

## 2022-07-29 NOTE — Patient Instructions (Signed)
It was a pleasure to see you today.  Thank you for giving [place] the opportunity to be involved in your care.  Below is a brief recap of your visit and next steps.  We will plan to see you again in 3 months.  Summary Increase tadalafil to 10 mg as needed No additional medication changes We will follow up in 3 months for your physical.

## 2022-07-29 NOTE — Assessment & Plan Note (Signed)
Currently prescribed atorvastatin 20 mg daily.  Repeat lipid panel at follow-up in 3 months

## 2022-09-26 ENCOUNTER — Telehealth: Payer: Self-pay | Admitting: Internal Medicine

## 2022-09-26 NOTE — Telephone Encounter (Signed)
Patient called in regard to bill form service date Aug 14, 2022  Visit was coded as outpatient established  Pt is now being charged. Wants a cll back in regard.

## 2022-10-18 ENCOUNTER — Ambulatory Visit (INDEPENDENT_AMBULATORY_CARE_PROVIDER_SITE_OTHER): Payer: Commercial Managed Care - PPO | Admitting: Internal Medicine

## 2022-10-18 ENCOUNTER — Encounter: Payer: Self-pay | Admitting: Internal Medicine

## 2022-10-18 VITALS — BP 138/70 | HR 70 | Ht 72.0 in | Wt 200.1 lb

## 2022-10-18 DIAGNOSIS — Z23 Encounter for immunization: Secondary | ICD-10-CM | POA: Diagnosis not present

## 2022-10-18 DIAGNOSIS — Z0001 Encounter for general adult medical examination with abnormal findings: Secondary | ICD-10-CM | POA: Insufficient documentation

## 2022-10-18 DIAGNOSIS — E103553 Type 1 diabetes mellitus with stable proliferative diabetic retinopathy, bilateral: Secondary | ICD-10-CM

## 2022-10-18 MED ORDER — OMNIPOD 5 G7 INTRO (GEN 5) KIT: 1.0000 | PACK | 0 refills | Status: AC

## 2022-10-18 MED ORDER — OMNIPOD 5 G7 PODS (GEN 5) MISC: 1.0000 | 3 refills | Status: DC

## 2022-10-18 NOTE — Patient Instructions (Signed)
It was a pleasure to see you today.  Thank you for giving [place] the opportunity to be involved in your care.  Below is a brief recap of your visit and next steps.  We will plan to see you again in 6 months.  Summary Annual exam completed today Omnipod ordered Repeat labs Flu shot today Follow up in 6 months

## 2022-10-18 NOTE — Progress Notes (Signed)
Complete physical exam  Patient: Sean Buchanan   DOB: 1965-02-27   57 y.o. Male  MRN: [id number]  Subjective:    Chief Complaint  Patient presents with   Annual Exam    CPE, would like to discuss glucose readings and a new machine.     Sean Buchanan is a 57 y.o. male who presents today for a complete physical exam. He reports consuming a general diet. Gym/ health club routine includes light weights and treadmill. He generally feels fairly well. He reports sleeping poorly. He does have additional problems to discuss today. He is interested in starting an Omnipod 5 instead of self-injecting insulin.    Most recent fall risk assessment:    10/18/2022    8:12 AM  Fall Risk   Falls in the past year? 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   Qualifier: Diagnosis of  By: Doreene Nest LPN, Jaime     Hyperlipidemia    Hypertension    IDDM (insulin dependent diabetes mellitus) 1983   AGE 62   Retinopathy, DIABETIC    Stroke (HCC) 04/20/2011   left facial weakness, and left hemiparesis   Past Surgical History:  Procedure Laterality Date   CATARACT EXTRACTION  2024   COLONOSCOPY N/A 08/30/2016   Procedure: COLONOSCOPY;  Surgeon: West Bali, MD;  [facility]: [facility];  Service: Endoscopy;  Laterality: N/A;  12:00pm   COLONOSCOPY WITH  PROPOFOL N/A 01/11/2022   Procedure: COLONOSCOPY WITH PROPOFOL;  Surgeon: Lanelle Bal, DO;  [facility];  Service: Endoscopy;  Laterality: N/A;  1:00 pm, pt knows to arrive at 7:30   ESOPHAGOGASTRODUODENOSCOPY N/A 08/30/2016   Procedure: ESOPHAGOGASTRODUODENOSCOPY (EGD);  Surgeon: West Bali, MD;  [facility];  Service: Endoscopy;  Laterality: N/A;   EYE SURGERY  01/03/2010   laser surgery to both eyes in July 18 and 26 , 2012   EYE SURGERY  2024   KNEE ARTHROSCOPY  1991 approx   POLYPECTOMY  01/11/2022   Procedure: POLYPECTOMY;  Surgeon: Lanelle Bal, DO;  [facility];  Service: Endoscopy;;   Social History   Tobacco Use   Smoking status: Never   Smokeless tobacco: Never  Vaping Use   Vaping status: Never Used  Substance Use Topics   Alcohol use: No   Drug use: No   Family History  Problem Relation Age of Onset   Diabetes Mother    Hyperlipidemia Mother    Hypertension Mother    Rosacea Mother    GER disease Father    Hypertension Father    Colon polyps Father    Colon polyps Sister    Diabetes Sister  Prostate cancer Paternal Uncle    Colon cancer Neg Hx    No Known Allergies   Patient Care Team: Billie Lade, MD as PCP - General (Internal Medicine) West Bali, MD (Inactive) as Consulting Physician (Gastroenterology)   Outpatient Medications Prior to Visit  Medication Sig Note   Continuous Blood Gluc Receiver (DEXCOM G7 RECEIVER) DEVI Use to check blood sugar.    Continuous Blood Gluc Sensor (DEXCOM G7 SENSOR) MISC Apply 1 sensor every 10 days.    HUMALOG KWIKPEN 100 UNIT/ML KwikPen INJECT 1 TO 25 UNITS UNDER THE SKIN WITH ALL MEALS AND SNACKS    insulin glargine-yfgn (SEMGLEE, YFGN,) 100 UNIT/ML Pen Inject 40 Units into the skin daily.    lisinopril (ZESTRIL) 20 MG tablet TAKE 1 TABLET(20 MG) BY MOUTH DAILY    ondansetron (ZOFRAN) 4 MG tablet TAKE 1 TABLET(4 MG) BY MOUTH EVERY 8 HOURS AS NEEDED FOR  NAUSEA OR VOMITING 10/18/2022: Needs new script.    atorvastatin (LIPITOR) 20 MG tablet TAKE 1 TABLET(20 MG) BY MOUTH DAILY (Patient not taking: Reported on 10/18/2022)    tadalafil (CIALIS) 20 MG tablet Take 0.5-1 tablets (10-20 mg total) by mouth every other day as needed for erectile dysfunction. (Patient not taking: Reported on 10/18/2022)    No facility-administered medications prior to visit.   Review of Systems  Constitutional:  Negative for chills and fever.  HENT:  Negative for sore throat.   Respiratory:  Negative for cough and shortness of breath.   Cardiovascular:  Negative for chest pain, palpitations and leg swelling.  Gastrointestinal:  Negative for abdominal pain, blood in stool, constipation, diarrhea, nausea and vomiting.  Genitourinary:  Negative for dysuria and hematuria.  Musculoskeletal:  Negative for myalgias.  Skin:  Negative for itching and rash.  Neurological:  Negative for dizziness and headaches.  Psychiatric/Behavioral:  Negative for depression and suicidal ideas.       Objective:     BP 138/70   Pulse 70   Ht 6' (1.829 m)   Wt 200 lb 1.3 oz (90.8 kg)   SpO2 97%   BMI 27.14 kg/m  BP Readings from Last 3 Encounters:  10/18/22 138/70  07/29/22 114/64  01/27/22 127/74   Physical Exam Vitals reviewed.  Constitutional:      General: He is not in acute distress.    Appearance: Normal appearance. He is not ill-appearing.  HENT:     Head: Normocephalic and atraumatic.     Right Ear: External ear normal.     Left Ear: External ear normal.     Nose: Nose normal. No congestion or rhinorrhea.     Mouth/Throat:     Mouth: Mucous membranes are moist.     Pharynx: Oropharynx is clear.  Eyes:     General: No scleral icterus.    Extraocular Movements: Extraocular movements intact.     Conjunctiva/sclera: Conjunctivae normal.     Pupils: Pupils are equal, round, and reactive to light.  Cardiovascular:     Rate and Rhythm: Normal rate and regular rhythm.      Pulses: Normal pulses.     Heart sounds: Normal heart sounds. No murmur heard. Pulmonary:     Effort: Pulmonary effort is normal.     Breath sounds: Normal breath sounds. No wheezing, rhonchi or rales.  Abdominal:     General: Abdomen is flat. Bowel sounds are normal. There is no distension.     Palpations: Abdomen is soft.     Tenderness: There is no  abdominal tenderness.  Musculoskeletal:        General: No swelling, tenderness or deformity. Normal range of motion.     Cervical back: Normal range of motion.  Skin:    General: Skin is warm and dry.     Capillary Refill: Capillary refill takes less than 2 seconds.  Neurological:     General: No focal deficit present.     Mental Status: He is alert and oriented to person, place, and time.     Motor: No weakness.  Psychiatric:        Mood and Affect: Mood normal.        Behavior: Behavior normal.        Thought Content: Thought content normal.   Last CBC Lab Results  Component Value Date   WBC 7.5 01/15/2022   HGB 14.3 01/15/2022   HCT 44.2 01/15/2022   MCV 85.2 01/15/2022   MCH 27.6 01/15/2022   RDW 13.7 01/15/2022   PLT 286 01/15/2022   Last metabolic panel Lab Results  Component Value Date   GLUCOSE 222 (H) 01/15/2022   NA 137 01/15/2022   K 4.0 01/15/2022   CL 105 01/15/2022   CO2 24 01/15/2022   BUN 13 01/15/2022   CREATININE 0.72 01/15/2022   GFRNONAA >60 01/15/2022   CALCIUM 8.8 (L) 01/15/2022   PROT 6.8 01/15/2022   ALBUMIN 3.7 01/15/2022   LABGLOB 2.5 12/30/2021   AGRATIO 1.8 12/30/2021   BILITOT 0.5 01/15/2022   ALKPHOS 123 01/15/2022   AST 28 01/15/2022   ALT 29 01/15/2022   ANIONGAP 8 01/15/2022   Last lipids Lab Results  Component Value Date   CHOL 167 11/02/2021   HDL 55 11/02/2021   LDLCALC 98 11/02/2021   TRIG 76 11/02/2021   CHOLHDL 3.0 11/02/2021   Last hemoglobin A1c Lab Results  Component Value Date   HGBA1C 8.0 (H) 08/03/2021   Last thyroid functions Lab Results   Component Value Date   TSH 2.419 10/15/2009   Last vitamin D Lab Results  Component Value Date   VD25OH 29.3 (L) 11/17/2021   Last vitamin B12 and Folate Lab Results  Component Value Date   VITAMINB12 309 12/01/2021   FOLATE 8.7 12/01/2021      Assessment & Plan:    Routine Health Maintenance and Physical Exam  Immunization History  Administered Date(s) Administered   Influenza,inj,Quad PF,6+ Mos 10/05/2021   Moderna Sars-Covid-2 Vaccination 01/02/2019, 01/30/2019, 01/09/2020   Pneumococcal Polysaccharide-23 12/08/2009   Td 12/08/2009   Tdap 08/03/2021   Zoster Recombinant(Shingrix) 08/03/2021, 10/05/2021    Health Maintenance  Topic Date Due   HEMOGLOBIN A1C  02/03/2022   INFLUENZA VACCINE  08/04/2022   COVID-19 Vaccine (4 - 2023-24 season) 09/04/2022   Diabetic kidney evaluation - Urine ACR  11/03/2022   FOOT EXAM  11/03/2022   OPHTHALMOLOGY EXAM  12/04/2022   Diabetic kidney evaluation - eGFR measurement  01/16/2023   DTaP/Tdap/Td (3 - Td or Tdap) 08/04/2031   Colonoscopy  01/12/2032   Hepatitis C Screening  Completed   HIV Screening  Completed   Zoster Vaccines- Shingrix  Completed   HPV VACCINES  Aged Out    Discussed health benefits of physical activity, and encouraged him to engage in regular exercise appropriate for his age and condition.  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  Reports that his  current insulin regimen includes Semglee 50 units every morning and sliding scale with meals.  Today he expresses an interest in using an OmniPod 5 as he thinks this will help improve his glycemic control and improve his quality of life as he will not need to inject insulin multiple times daily.  Using a Dexcom G7 currently. -Repeat A1c ordered today -OmniPod 5 kit ordered today -Diabetes related preventative care items are up-to-date      Encounter for well  adult exam with abnormal findings    Annual exam completed today.  Previous records and labs reviewed -Repeat labs ordered today -Flu shot administered today -We will tentatively plan for follow-up in 6 months      Need for influenza vaccination    Influenza vaccine administered today      Return in about 6 months (around 04/18/2023).     Billie Lade, MD

## 2022-10-18 NOTE — Assessment & Plan Note (Signed)
Influenza vaccine administered today.

## 2022-10-18 NOTE — Assessment & Plan Note (Signed)
Previous A1c 7.4.  Reports that his current insulin regimen includes Semglee 50 units every morning and sliding scale with meals.  Today he expresses an interest in using an OmniPod 5 as he thinks this will help improve his glycemic control and improve his quality of life as he will not need to inject insulin multiple times daily.  Using a Dexcom G7 currently. -Repeat A1c ordered today -OmniPod 5 kit ordered today -Diabetes related preventative care items are up-to-date

## 2022-10-18 NOTE — Assessment & Plan Note (Signed)
Annual exam completed today.  Previous records and labs reviewed -Repeat labs ordered today -Flu shot administered today -We will tentatively plan for follow-up in 6 months

## 2022-10-19 LAB — HEMOGLOBIN A1C
Est. average glucose Bld gHb Est-mCnc: 194 mg/dL
Hgb A1c MFr Bld: 8.4 % — ABNORMAL HIGH (ref 4.8–5.6)

## 2022-10-24 ENCOUNTER — Telehealth: Payer: Self-pay | Admitting: Internal Medicine

## 2022-10-24 MED ORDER — INSULIN LISPRO 100 UNIT/ML IJ SOLN: 1.0000 [IU] | Freq: Three times a day (TID) | INTRAMUSCULAR | 11 refills | Status: DC

## 2022-10-24 NOTE — Telephone Encounter (Signed)
Patient calling says he is needing a vial of insulin he can not draw from his pen with Omnipod. Please advise [facility] on [address] Thank you

## 2022-10-27 ENCOUNTER — Telehealth: Payer: Self-pay | Admitting: Internal Medicine

## 2022-10-27 NOTE — Telephone Encounter (Signed)
Sean Buchanan Omnipod dropped off pump therapy form to be filled out  Call susan at [phone] any questions with form or call when ready for pick up. Or you can email.  Noted Copied Sleeved (put in providers box)

## 2022-11-07 NOTE — Telephone Encounter (Signed)
Called patient form ready for pick up 

## 2022-11-15 LAB — HM DIABETES EYE EXAM

## 2022-11-21 ENCOUNTER — Telehealth: Payer: Self-pay

## 2022-11-21 NOTE — Telephone Encounter (Signed)
Copied from CRM [id number]. Topic: Clinical - Medication Question >> Nov 17, 2022  1:06 PM Theodis Sato wrote: Reason for CRM: PT needs a call from DR. Dixons nurse about paperwork regarding omni pod delivery systems.

## 2022-11-21 NOTE — Telephone Encounter (Signed)
lmtrc

## 2022-11-22 ENCOUNTER — Telehealth: Payer: Self-pay | Admitting: Internal Medicine

## 2022-11-22 DIAGNOSIS — E103553 Type 1 diabetes mellitus with stable proliferative diabetic retinopathy, bilateral: Secondary | ICD-10-CM

## 2022-11-22 NOTE — Telephone Encounter (Signed)
Copied from CRM [id number]. Topic: Referral - Request for Referral >> Nov 22, 2022  1:52 PM Raven B wrote: Did the patient discuss referral with their provider in the last year? No (If No - schedule appointment) PT declined appointment  (If Yes - send message)  Appointment offered? No  Type of order/referral and detailed reason for visit: needs paperwork completed regarding Insulin Disposable Pump (OMNIPOD 5 G7 INTRO, GEN 5   Preference of office, provider, location: Ronny Bacon, FNP  If referral order, have you been seen by this specialty before? No (If Yes, this issue or another issue? When? Where?  Can we respond through MyChart? No

## 2022-11-22 NOTE — Telephone Encounter (Signed)
Spoke to patient he said already discuss with Dr Durwin Nora, asking for a referral to [facility] but continue care with Dr Durwin Nora.

## 2022-11-22 NOTE — Addendum Note (Signed)
Addended by: Billie Lade on: 11/22/2022 04:38 PM   Modules accepted: Orders

## 2022-11-29 ENCOUNTER — Other Ambulatory Visit: Payer: Self-pay | Admitting: Internal Medicine

## 2022-12-06 ENCOUNTER — Other Ambulatory Visit: Payer: Self-pay

## 2022-12-06 ENCOUNTER — Other Ambulatory Visit: Payer: Commercial Managed Care - PPO

## 2022-12-06 DIAGNOSIS — E103553 Type 1 diabetes mellitus with stable proliferative diabetic retinopathy, bilateral: Secondary | ICD-10-CM

## 2022-12-09 ENCOUNTER — Other Ambulatory Visit: Payer: Commercial Managed Care - PPO

## 2022-12-12 ENCOUNTER — Encounter: Payer: Self-pay | Admitting: "Endocrinology

## 2022-12-12 ENCOUNTER — Ambulatory Visit (INDEPENDENT_AMBULATORY_CARE_PROVIDER_SITE_OTHER): Payer: Commercial Managed Care - PPO | Admitting: "Endocrinology

## 2022-12-12 VITALS — BP 120/78 | HR 76 | Ht 72.0 in | Wt 227.8 lb

## 2022-12-12 DIAGNOSIS — E7800 Pure hypercholesterolemia, unspecified: Secondary | ICD-10-CM

## 2022-12-12 DIAGNOSIS — E1065 Type 1 diabetes mellitus with hyperglycemia: Secondary | ICD-10-CM | POA: Diagnosis not present

## 2022-12-12 MED ORDER — INSULIN GLARGINE-YFGN 100 UNIT/ML ~~LOC~~ SOPN: 40.0000 [IU] | PEN_INJECTOR | Freq: Two times a day (BID) | SUBCUTANEOUS | 0 refills | Status: AC

## 2022-12-12 MED ORDER — DEXCOM G7 SENSOR MISC: 1.0000 | 3 refills | Status: DC

## 2022-12-12 MED ORDER — BAQSIMI ONE PACK 3 MG/DOSE NA POWD: 1.0000 | NASAL | 3 refills | Status: AC | PRN

## 2022-12-12 MED ORDER — INSULIN LISPRO 100 UNIT/ML IJ SOLN: 1.0000 [IU] | Freq: Three times a day (TID) | INTRAMUSCULAR | 0 refills | Status: DC

## 2022-12-12 NOTE — Patient Instructions (Signed)

## 2022-12-12 NOTE — Progress Notes (Signed)
Outpatient Endocrinology Note Sean Presidio, MD  12/12/22   Salley Hews 14-Aug-1965 [id number]  Referring Provider: Billie Lade, MD Primary Care Provider: Billie Lade, MD Reason for consultation: Subjective   Assessment & Plan  Diagnoses and all orders for this visit:  Uncontrolled type 1 diabetes mellitus with hyperglycemia (HCC) -     Cancel: Ambulatory referral to diabetic education -     Ambulatory referral to diabetic education  Pure hypercholesterolemia  Other orders -     Glucagon (BAQSIMI ONE PACK) 3 MG/DOSE POWD; Place 1 Device into the nose as needed (Low blood sugar with impaired consciousness). -     insulin glargine-yfgn (SEMGLEE, YFGN,) 100 UNIT/ML Pen; Inject 40 Units into the skin 2 (two) times daily. -     insulin lispro (HUMALOG) 100 UNIT/ML injection; Inject 0.01-0.25 mLs (1-25 Units total) into the skin 3 (three) times daily with meals. -     Continuous Glucose Sensor (DEXCOM G7 SENSOR) MISC; 1 Device by Does not apply route continuous.    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, stroke No results found for: "GFR" Hba1c goal less than 7, current Hba1c is  Lab Results  Component Value Date   HGBA1C 8.2 (H) 12/09/2022   Will recommend the following: Semglee 40 units bid Humalog 15-18 units 3-4 times a day (add sliding scale proactively and not take it 2 hours after meals)  Eats 5 meals, drinks every little water, diet sodas, snacks all day with chips etc, works 7pm-7am Discussed dietary changes Ordered DM education patient has omnipod gen 5 and wants to start it Instructed to make f/u with podiatry to address ?fungal toe nails   No known contraindications/side effects to any of above medications Glucagon discussed and prescribed with refills on 12/12/22  -Last LD and Tg are as follows: Lab Results  Component Value Date   LDLCALC 115 (H) 12/09/2022    Lab Results  Component Value Date   TRIG 54 12/09/2022   -self stopped  atorvastatin 20 mg every day, needs to resume it -Follow low fat diet and exercise   -Blood pressure goal <140/90 - Microalbumin/creatinine goal is < 30 -Last MA/Cr is as follows: Lab Results  Component Value Date   MICROALBUR 0.2 12/09/2022   -on ACE/ARB lisinopril 20 mg qd -diet changes including salt restriction -limit eating outside -counseled BP targets per standards of diabetes care -uncontrolled blood pressure can lead to retinopathy, nephropathy and cardiovascular and atherosclerotic heart disease  Reviewed and counseled on: -A1C target -Blood sugar targets -Complications of uncontrolled diabetes  -Checking blood sugar before meals and bedtime and bring log next visit -All medications with mechanism of action and side effects -Hypoglycemia management: rule of 15's, Glucagon Emergency Kit and medical alert ID -low-carb low-fat plate-method diet -At least 20 minutes of physical activity per day -Annual dilated retinal eye exam and foot exam -compliance and follow up needs -follow up as scheduled or earlier if problem gets worse  Call if blood sugar is less than 70 or consistently above 250    Take a 15 gm snack of carbohydrate at bedtime before you go to sleep if your blood sugar is less than 100.    If you are going to fast after midnight for a test or procedure, ask your physician for instructions on how to reduce/decrease your insulin dose.    Call if blood sugar is less than 70 or consistently above 250  -Treating a low sugar by rule of  15  (15 gms of sugar every 15 min until sugar is more than 70) If you feel your sugar is low, test your sugar to be sure If your sugar is low (less than 70), then take 15 grams of a fast acting Carbohydrate (3-4 glucose tablets or glucose gel or 4 ounces of juice or regular soda) Recheck your sugar 15 min after treating low to make sure it is more than 70 If sugar is still less than 70, treat again with 15 grams of carbohydrate           [address] drive the hour of hypoglycemia  If unconscious/unable to eat or drink by mouth, use glucagon injection or nasal spray baqsimi and call 911. Can repeat again in 15 min if still unconscious.  Return in about 2 weeks (around 12/26/2022).   I have reviewed current medications, nurse's notes, allergies, vital signs, past medical and surgical history, family medical history, and social history for this encounter. Counseled patient on symptoms, examination findings, lab findings, imaging results, treatment decisions and monitoring and prognosis. The patient understood the recommendations and agrees with the treatment plan. All questions regarding treatment plan were fully answered.  Sean Crest, MD  12/12/22    History of Present Illness Sean Buchanan is a 57 y.o. year old male who presents for evaluation of Type I diabetes mellitus.  DARYL JAVORSKY was first diagnosed around age 64.   Diabetes education +  Home diabetes regimen: Semglee 40 units bid Humalog 15 units 3-4 times a day, sliding scale 1:50>150 two hours after   COMPLICATIONS +  MI/Stroke +  retinopathy -  neuropathy -  nephropathy  BLOOD SUGAR DATA  CGM interpretation: At today's visit, we reviewed her CGM downloads. The full report is scanned in the media. Reviewing the CGM trends, BG are mainly elevated overnight.    Physical Exam  BP 120/78   Pulse 76   Ht 6' (1.829 m)   Wt 227 lb 12.8 oz (103.3 kg)   SpO2 98%   BMI 30.90 kg/m    Constitutional: well developed, well nourished Head: normocephalic, atraumatic Eyes: sclera anicteric, no redness Neck: supple Lungs: normal respiratory effort Neurology: alert and oriented Skin: dry, no appreciable rashes Musculoskeletal: no appreciable defects Psychiatric: normal mood and affect Diabetic Foot Exam - Simple   Simple Foot Form Diabetic Foot exam was performed with the following findings: Yes 12/12/2022  8:22 AM  Visual Inspection No deformities, no  ulcerations, no other skin breakdown bilaterally: Yes Sensation Testing Intact to touch and monofilament testing bilaterally: Yes Pulse Check Posterior Tibialis and Dorsalis pulse intact bilaterally: Yes Comments Thick toe nails      Current Medications Patient's Medications  New Prescriptions   CONTINUOUS GLUCOSE SENSOR (DEXCOM G7 SENSOR) MISC    1 Device by Does not apply route continuous.   GLUCAGON (BAQSIMI ONE PACK) 3 MG/DOSE POWD    Place 1 Device into the nose as needed (Low blood sugar with impaired consciousness).  Previous Medications   ATORVASTATIN (LIPITOR) 20 MG TABLET    TAKE 1 TABLET(20 MG) BY MOUTH DAILY   CONTINUOUS BLOOD GLUC RECEIVER (DEXCOM G7 RECEIVER) DEVI    Use to check blood sugar.   CONTINUOUS BLOOD GLUC SENSOR (DEXCOM G7 SENSOR) MISC    Apply 1 sensor every 10 days.   INSULIN DISPOSABLE PUMP (OMNIPOD 5 G7 INTRO, GEN 5,) KIT    1 kit by Does not apply route continuous.   INSULIN DISPOSABLE PUMP (OMNIPOD 5  G7 PODS, GEN 5,) MISC    1 each by Does not apply route continuous.   LISINOPRIL (ZESTRIL) 20 MG TABLET    TAKE 1 TABLET(20 MG) BY MOUTH DAILY   ONDANSETRON (ZOFRAN) 4 MG TABLET    TAKE 1 TABLET(4 MG) BY MOUTH EVERY 8 HOURS AS NEEDED FOR NAUSEA OR VOMITING   TADALAFIL (CIALIS) 20 MG TABLET    Take 0.5-1 tablets (10-20 mg total) by mouth every other day as needed for erectile dysfunction.  Modified Medications   Modified Medication Previous Medication   INSULIN GLARGINE-YFGN (SEMGLEE, YFGN,) 100 UNIT/ML PEN insulin glargine-yfgn (SEMGLEE, YFGN,) 100 UNIT/ML Pen      Inject 40 Units into the skin 2 (two) times daily.    Inject 40 Units into the skin daily.   INSULIN LISPRO (HUMALOG) 100 UNIT/ML INJECTION insulin lispro (HUMALOG) 100 UNIT/ML injection      Inject 0.01-0.25 mLs (1-25 Units total) into the skin 3 (three) times daily with meals.    Inject 0.01-0.25 mLs (1-25 Units total) into the skin 3 (three) times daily with meals.  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   Qualifier: Diagnosis of  By: Doreene Nest LPN, Jaime     Hyperlipidemia    Hypertension    IDDM (insulin dependent diabetes mellitus) 1983   AGE 36   Retinopathy, DIABETIC    Stroke (HCC) 04/20/2011   left facial weakness, and left hemiparesis    Past Surgical History Past Surgical History:  Procedure Laterality Date   CATARACT EXTRACTION  2024   COLONOSCOPY N/A 08/30/2016   Procedure: COLONOSCOPY;  Surgeon: West Bali, MD;  [facility]: [facility];  Service: Endoscopy;  Laterality: N/A;  12:00pm   COLONOSCOPY WITH PROPOFOL N/A 01/11/2022   Procedure: COLONOSCOPY WITH PROPOFOL;  Surgeon: Lanelle Bal, DO;  Location: [facility];  Service: Endoscopy;  Laterality: N/A;  1:00 pm, pt knows to arrive at 7:30   ESOPHAGOGASTRODUODENOSCOPY N/A 08/30/2016   Procedure: ESOPHAGOGASTRODUODENOSCOPY (EGD);  Surgeon: West Bali, MD;  Location: [facility];  Service: Endoscopy;  Laterality: N/A;   EYE SURGERY  01/03/2010   laser surgery to both eyes in July 18 and 26 , 2012   EYE SURGERY  2024   KNEE ARTHROSCOPY  1991 approx   POLYPECTOMY  01/11/2022   Procedure: POLYPECTOMY;  Surgeon: Lanelle Bal, DO;  Location: [facility];  Service: Endoscopy;;    Family History family history includes Colon polyps in his father and sister; Diabetes in his mother and sister; GER disease in his father; Hyperlipidemia in his mother; Hypertension in his father and  mother; Prostate cancer in his paternal uncle; Rosacea in his mother.  Social History Social History   Socioeconomic History   Marital status: Single    Spouse name: Not on file   Number of children: Not on file   Years of education: Not on file   Highest education level:  Not on file  Occupational History   Occupation: EMT   Tobacco Use   Smoking status: Never   Smokeless tobacco: Never  Vaping Use   Vaping status: Never Used  Substance and Sexual Activity   Alcohol use: No   Drug use: No   Sexual activity: Not Currently  Other Topics Concern   Not on file  Social History Narrative   Lives with mom and dad   Cat 3 outside       Enjoy: tv- h[employer] max -sci fy      Diet: eats all food groups-- a lot of fast food    Caffeine: soda daily 2 liters or more daily    Water: 2-4 cups       Wears seat belt    Does not use phone while driving   Smoke Conservator, museum/gallery -safe location    Social Determinants of Health   Financial Resource Strain: Low Risk  (08/10/2021)   Received from [facility], [facility]   Overall Financial Resource Strain (CARDIA)    Difficulty of Paying Living Expenses: Not hard at all  Food Insecurity: No Food Insecurity (08/10/2021)   Received [facility], [facility]   Hunger Vital Sign    Worried About Running Out of Food in the Last Year: Never true    Ran Out of Food in the Last Year: Never true  Transportation Needs: No Transportation Needs (08/10/2021)   Received from [facility], [facility]   [facility] - Transportation    Lack of Transportation (Medical): No    Lack of Transportation (Non-Medical): No  Physical Activity: Insufficiently Active (11/13/2019)   Exercise Vital Sign    Days of Exercise per Week: 1 day    Minutes of Exercise per Session: 20 min  Stress: No Stress Concern Present (11/13/2019)   [employer] of Occupational Health - Occupational Stress Questionnaire    Feeling of  Stress : Only a little  Social Connections: Unknown (05/18/2021)   Received from [facility], [facility]   Social Network    Social Network: Not on file  Intimate Partner Violence: Unknown (04/09/2021)   Received from [facility], [facility]   HITS    Physically Hurt: Not on file    Insult or Talk Down To: Not on file    Threaten Physical Harm: Not on file    Scream or Curse: Not on file    Lab Results  Component Value Date   HGBA1C 8.2 (H) 12/09/2022   HGBA1C 8.4 (H) 10/18/2022   HGBA1C 8.0 (H) 08/03/2021   Lab Results  Component Value Date   CHOL 198 12/09/2022   Lab Results  Component Value Date   HDL 69 12/09/2022   Lab Results  Component Value Date   LDLCALC 115 (H) 12/09/2022   Lab Results  Component Value Date   TRIG 54 12/09/2022   Lab Results  Component Value Date   CHOLHDL 2.9 12/09/2022   Lab Results  Component Value Date   CREATININE 0.86 12/09/2022   No results found for: "GFR" Lab Results  Component Value Date   MICROALBUR 0.2 12/09/2022      Component Value Date/Time   NA 138 12/09/2022 1015   NA 141 12/30/2021 0852   K 4.2 12/09/2022 1015   CL 102 12/09/2022 1015   CO2 27 12/09/2022 1015   GLUCOSE 104 (H) 12/09/2022 1015   BUN 12 12/09/2022  1015   BUN 13 12/30/2021 0852   CREATININE 0.86 12/09/2022 1015   CALCIUM 9.6 12/09/2022 1015   PROT 7.2 12/09/2022 1015   PROT 6.9 12/30/2021 0852   ALBUMIN 3.7 01/15/2022 0656   ALBUMIN 4.4 12/30/2021 0852   AST 16 12/09/2022 1015   ALT 19 12/09/2022 1015   ALKPHOS 123 01/15/2022 0656   BILITOT 1.0 12/09/2022 1015   BILITOT 0.7 12/30/2021 0852   GFRNONAA >60 01/15/2022 0656   GFRNONAA >60 08/03/2010 1545   GFRAA 119 02/05/2020 1140   GFRAA >60 08/03/2010 1545      Latest Ref Rng & Units 12/09/2022   10:15 AM 01/15/2022    6:56 AM 12/30/2021    8:52 AM  BMP  Glucose 65 - 99 mg/dL 621  308  657   BUN 7 - 25 mg/dL 12  13  13    Creatinine 0.70 - 1.30 mg/dL 8.46  9.62  9.52    BUN/Creat Ratio 6 - 22 (calc) SEE NOTE:   16   Sodium 135 - 146 mmol/L 138  137  141   Potassium 3.5 - 5.3 mmol/L 4.2  4.0  4.6   Chloride 98 - 110 mmol/L 102  105  104   CO2 20 - 32 mmol/L 27  24  22    Calcium 8.6 - 10.3 mg/dL 9.6  8.8  9.3        Component Value Date/Time   WBC 7.5 01/15/2022 0656   RBC 5.19 01/15/2022 0656   HGB 14.3 01/15/2022 0656   HGB 15.4 12/20/2021 1453   HCT 44.2 01/15/2022 0656   HCT 46.2 12/20/2021 1453   PLT 286 01/15/2022 0656   PLT 307 12/20/2021 1453   MCV 85.2 01/15/2022 0656   MCV 82 12/20/2021 1453   MCH 27.6 01/15/2022 0656   MCHC 32.4 01/15/2022 0656   RDW 13.7 01/15/2022 0656   RDW 13.2 12/20/2021 1453   LYMPHSABS 1.8 12/20/2021 1453   MONOABS 0.5 10/13/2009 2150   EOSABS 0.1 12/20/2021 1453   BASOSABS 0.0 12/20/2021 1453     Parts of this note may have been dictated using voice recognition software. There may be variances in spelling and vocabulary which are unintentional. Not all errors are proofread. Please notify the Thereasa Parkin if any discrepancies are noted or if the meaning of any statement is not clear.

## 2022-12-13 ENCOUNTER — Telehealth: Payer: Self-pay | Admitting: Internal Medicine

## 2022-12-13 NOTE — Telephone Encounter (Signed)
Has appt 12/31 but wants to be contacted if Durwin Nora has a cancellation to come in sooner sue to recent weight gain

## 2022-12-13 NOTE — Telephone Encounter (Signed)
Copied from CRM [id number]. Topic: General - Other >> Dec 12, 2022  3:34 PM Sean Buchanan wrote: Reason for CRM: Pt called concern about weight gain. Pt wants to confirm correct weight increase, please call pt at your earliest convenience.

## 2022-12-15 ENCOUNTER — Telehealth: Payer: Self-pay | Admitting: "Endocrinology

## 2022-12-15 NOTE — Telephone Encounter (Signed)
Patient asking to transfer care from DR. Motwani to Dr. Erroll Luna, states he was disrespected

## 2022-12-17 LAB — COMPREHENSIVE METABOLIC PANEL
AG Ratio: 1.5 (calc) (ref 1.0–2.5)
ALT: 19 U/L (ref 9–46)
AST: 16 U/L (ref 10–35)
Albumin: 4.3 g/dL (ref 3.6–5.1)
Alkaline phosphatase (APISO): 139 U/L (ref 35–144)
BUN: 12 mg/dL (ref 7–25)
CO2: 27 mmol/L (ref 20–32)
Calcium: 9.6 mg/dL (ref 8.6–10.3)
Chloride: 102 mmol/L (ref 98–110)
Creat: 0.86 mg/dL (ref 0.70–1.30)
Globulin: 2.9 g/dL (ref 1.9–3.7)
Glucose, Bld: 104 mg/dL — ABNORMAL HIGH (ref 65–99)
Potassium: 4.2 mmol/L (ref 3.5–5.3)
Sodium: 138 mmol/L (ref 135–146)
Total Bilirubin: 1 mg/dL (ref 0.2–1.2)
Total Protein: 7.2 g/dL (ref 6.1–8.1)

## 2022-12-17 LAB — LIPID PANEL
Cholesterol: 198 mg/dL (ref <200)
HDL: 69 mg/dL (ref >=40)
LDL Cholesterol (Calc): 115 mg/dL — ABNORMAL HIGH
Non-HDL Cholesterol (Calc): 129 mg/dL (ref <130)
Total CHOL/HDL Ratio: 2.9 (calc) (ref <5.0)
Triglycerides: 54 mg/dL (ref <150)

## 2022-12-17 LAB — GAD65, IA-2, AND INSULIN AUTOANTIBODY SERUM
Glutamic Acid Decarb Ab: <5 [IU]/mL (ref <5)
IA-2 Antibody: <5.4 U/mL (ref <5.4)
Insulin Antibodies, Human: <0.4 U/mL (ref <0.4)

## 2022-12-17 LAB — MICROALBUMIN / CREATININE URINE RATIO
Creatinine, Urine: 26 mg/dL (ref 20–320)
Microalb Creat Ratio: 8 mg/g{creat} (ref <30)
Microalb, Ur: 0.2 mg/dL

## 2022-12-17 LAB — C-PEPTIDE: C-Peptide: 0.31 ng/mL — ABNORMAL LOW (ref 0.80–3.85)

## 2022-12-17 LAB — HEMOGLOBIN A1C
Hgb A1c MFr Bld: 8.2 %{Hb} — ABNORMAL HIGH (ref <5.7)
Mean Plasma Glucose: 189 mg/dL
eAG (mmol/L): 10.4 mmol/L

## 2022-12-19 ENCOUNTER — Telehealth: Payer: Self-pay | Admitting: Nutrition

## 2022-12-19 NOTE — Telephone Encounter (Signed)
Lvm to call me to schedule pump training 

## 2022-12-26 ENCOUNTER — Ambulatory Visit: Payer: Commercial Managed Care - PPO | Admitting: "Endocrinology

## 2023-01-02 ENCOUNTER — Encounter: Payer: Commercial Managed Care - PPO | Attending: Endocrinology | Admitting: Nutrition

## 2023-01-02 DIAGNOSIS — E10311 Type 1 diabetes mellitus with unspecified diabetic retinopathy with macular edema: Secondary | ICD-10-CM | POA: Insufficient documentation

## 2023-01-03 ENCOUNTER — Ambulatory Visit (INDEPENDENT_AMBULATORY_CARE_PROVIDER_SITE_OTHER): Payer: Commercial Managed Care - PPO | Admitting: Internal Medicine

## 2023-01-03 ENCOUNTER — Encounter: Payer: Self-pay | Admitting: Internal Medicine

## 2023-01-03 ENCOUNTER — Telehealth: Payer: Self-pay | Admitting: Nutrition

## 2023-01-03 VITALS — BP 122/60 | HR 78 | Ht 72.0 in | Wt 226.0 lb

## 2023-01-03 DIAGNOSIS — R635 Abnormal weight gain: Secondary | ICD-10-CM | POA: Diagnosis not present

## 2023-01-03 NOTE — Progress Notes (Signed)
Patient was trained on the use of the OmniPod 5 insulin pump.  Settings were put in by the patient: TDD of insulin equals 155u, minus 20% equals 124u.  Basal rate is 62u divided by 24 equals 2.6u/hr.  I/C: 1 (patient has not carb counted in many years).  We discussed the advantages of this, for a more accurate meal bolus, but he did not want to do this at this time.  ISF calculated at 1700divided by TDD equals 11.  This was changed to 20 due to patient's fear of low blood sugars., target: 120 with correction over 150.(  Pt. Did not want a lower target fearing low blood sugars at work). Timeing: 4 hours bolus max: 30u, basal max: 2.   His Dexcom G7 was linked to his PDM and his bloodsugar was 327 at this time. He filled a pod with Humalog insulin and and we discussed the correct placement of the pods with reference to his sensor. He attached this on his left arm without difficulty.   We reviewed how to bolus, how and when to do a correction bolus and the need to put in blood sugar readings for every bolus.  and he re demonstrated how to bolus correctly and did a correction dose at this time.  We reviewed all topics on the pump training checklist and he signed this as indicating understanding with no final questions. He reverbalized again:  1. The need to bolus for all meals and snacks, 2. The need to do correction doses for all blood sugar readings over 250, 3. And the need to change the pod when empty.   Discussed that  his diet is very high in fat and need for more insulin.  He has gained 30 pounds in one year.  Stressed need to reduce fried foods and eating out at every meal, because this pump will make him gain more weight if he continues to over eat.  Discussed how this pump works in manual mode and the fact that it will give him more insulin for higher blood sugars and cause more weight gain.  He is currently eating 3 meals and 3 snacks.  Suggested he stop between meal snacks and limit total daily calories  to 2000.  He had 900 at breakfast his AM.  He reported good understanding of this and had no final questions for me.

## 2023-01-03 NOTE — Telephone Encounter (Signed)
 Patient reports no difficult wearing pod, giving boluses or sleeping with this.  He does report 3 low blood sugars going down into the 80s and 90s before meals.  Basal rate was decrease to 2.5u/hr.  He reports no difficulty bolusing, but does report that he is eating less food, and so has reduced his meal time insulin  doses from 20 to 15-18u as needed.   I will see him in 1 week and discuss carb counting again for a more accurate meal time insulin  dose.   He had no final questions for me.

## 2023-01-03 NOTE — Progress Notes (Signed)
 Acute Office Visit  Subjective:     Patient ID: Sean Buchanan, male    DOB: [birth year], 57 y.o.   MRN: [id number]  Chief Complaint  Patient presents with   Weight Check    Patient complains of increased weight gain starting a month ago, with distended abdomen. Has not changed diet or anything recently.    [name] presents today for an acute visit to discuss weight gain. His weight today is 226 lbs. He weighed 200 lbs when previously evaluated by me in October. Previously weighed 221 lbs at his appointment in July.  He has been exercising twice weekly and there have been no significant changes to his diet.  He has not appreciated any additional symptoms such as lower extremity edema, increased dyspnea with exertion, or orthopnea/PND.  Review of Systems  Constitutional:  Negative for chills and fever.  HENT:  Negative for sore throat.   Respiratory:  Negative for cough and shortness of breath.   Cardiovascular:  Negative for chest pain, palpitations and leg swelling.  Gastrointestinal:  Negative for abdominal pain, blood in stool, constipation, diarrhea, nausea and vomiting.  Genitourinary:  Negative for dysuria and hematuria.  Musculoskeletal:  Negative for myalgias.  Skin:  Negative for itching and rash.  Neurological:  Negative for dizziness and headaches.  Psychiatric/Behavioral:  Negative for depression and suicidal ideas.       Objective:    BP 122/60   Pulse 78   Ht 6' (1.829 m)   Wt 226 lb (102.5 kg)   SpO2 95%   BMI 30.65 kg/m   Physical Exam Vitals reviewed.  Constitutional:      General: He is not in acute distress.    Appearance: Normal appearance. He is obese. He is not ill-appearing.  HENT:     Head: Normocephalic and atraumatic.     Right Ear: External ear normal.     Left Ear: External ear normal.     Nose: Nose normal. No congestion or rhinorrhea.     Mouth/Throat:     Mouth: Mucous membranes are moist.     Pharynx: Oropharynx is clear.  Eyes:      General: No scleral icterus.    Extraocular Movements: Extraocular movements intact.     Conjunctiva/sclera: Conjunctivae normal.     Pupils: Pupils are equal, round, and reactive to light.  Cardiovascular:     Rate and Rhythm: Normal rate and regular rhythm.     Pulses: Normal pulses.     Heart sounds: Normal heart sounds. No murmur heard. Pulmonary:     Effort: Pulmonary effort is normal.     Breath sounds: Normal breath sounds. No wheezing, rhonchi or rales.  Abdominal:     General: Abdomen is flat. Bowel sounds are normal. There is no distension.     Palpations: Abdomen is soft.     Tenderness: There is no abdominal tenderness.  Musculoskeletal:        General: No swelling, tenderness or deformity. Normal range of motion.     Cervical back: Normal range of motion.  Skin:    General: Skin is warm and dry.     Capillary Refill: Capillary refill takes less than 2 seconds.  Neurological:     General: No focal deficit present.     Mental Status: He is alert and oriented to person, place, and time.     Motor: No weakness.  Psychiatric:        Mood and Affect: Mood normal.  Behavior: Behavior normal.        Thought Content: Thought content normal.       Assessment & Plan:   Problem List Items Addressed This Visit       Weight gain - Primary   Presenting today for an acute visit in the setting of weight gain.  His weight today is 226 lbs, which is increased from 200 lbs when last seen by me in October.  Of note, his weight was 221 lbs in July.  He has been exercising twice weekly and there has not been any significant change to his diet over the last 2 months.  No acute findings are appreciated on exam today.  Suspect weight gain is due to poor dieting habits and intensive insulin  therapy in the setting of type 1 diabetes. -We reviewed appropriate dieting habits today and the need to maintain a consistent exercise regimen.  I recommended adding strength training to the  cardiovascular exercise he is currently doing.  We also discussed a referral to medical nutrition therapy to discuss appropriate dieting habits in the setting of T1DM.  He has declined for now, but will notify our office if he changes his mind.  He was instructed to return to care if he continues to gain weight or develops additional symptoms.  Otherwise, he will return to care for previously scheduled routine follow-up in April 2025.      Return if symptoms worsen or fail to improve.  Manus FORBES Fireman, MD

## 2023-01-03 NOTE — Patient Instructions (Signed)
 It was a pleasure to see you today.  Thank you for giving us  the opportunity to be involved in your care.  Below is a brief recap of your visit and next steps.  We will plan to see you again in April.  Summary As we discussed, I recommend focusing on dietary changes in an effort to lose weight. Add strength training to your exercise plan. Follow up if you continue to gain weight or are not seeing any improvement.

## 2023-01-03 NOTE — Assessment & Plan Note (Signed)
 Presenting today for an acute visit in the setting of weight gain.  His weight today is 226 lbs, which is increased from 200 lbs when last seen by me in October.  Of note, his weight was 221 lbs in July.  He has been exercising twice weekly and there has not been any significant change to his diet over the last 2 months.  No acute findings are appreciated on exam today.  Suspect weight gain is due to poor dieting habits and intensive insulin  therapy in the setting of type 1 diabetes. -We reviewed appropriate dieting habits today and the need to maintain a consistent exercise regimen.  I recommended adding strength training to the cardiovascular exercise he is currently doing.  We also discussed a referral to medical nutrition therapy to discuss appropriate dieting habits in the setting of T1DM.  He has declined for now, but will notify our office if he changes his mind.  He was instructed to return to care if he continues to gain weight or develops additional symptoms.  Otherwise, he will return to care for previously scheduled routine follow-up in April 2025.

## 2023-01-03 NOTE — Telephone Encounter (Signed)
Patient called and told that his user name and password that he gave me for OmniPod is incorrect.  He was given a telephone number to call to get this information and call me back.

## 2023-01-05 ENCOUNTER — Other Ambulatory Visit: Payer: Self-pay

## 2023-01-05 ENCOUNTER — Other Ambulatory Visit: Payer: Self-pay | Admitting: Internal Medicine

## 2023-01-05 DIAGNOSIS — I10 Essential (primary) hypertension: Secondary | ICD-10-CM

## 2023-01-10 ENCOUNTER — Other Ambulatory Visit: Payer: Self-pay | Admitting: Internal Medicine

## 2023-01-10 DIAGNOSIS — E1069 Type 1 diabetes mellitus with other specified complication: Secondary | ICD-10-CM

## 2023-02-02 ENCOUNTER — Encounter: Payer: Self-pay | Admitting: Endocrinology

## 2023-02-02 ENCOUNTER — Ambulatory Visit: Payer: Commercial Managed Care - PPO | Admitting: Endocrinology

## 2023-02-02 VITALS — BP 138/60 | HR 75 | Resp 20 | Ht 72.0 in | Wt 229.8 lb

## 2023-02-02 DIAGNOSIS — E103553 Type 1 diabetes mellitus with stable proliferative diabetic retinopathy, bilateral: Secondary | ICD-10-CM

## 2023-02-02 DIAGNOSIS — E1065 Type 1 diabetes mellitus with hyperglycemia: Secondary | ICD-10-CM | POA: Diagnosis not present

## 2023-02-02 MED ORDER — DEXCOM G7 SENSOR MISC: 1.0000 | 3 refills | Status: AC

## 2023-02-02 MED ORDER — OMNIPOD 5 G7 PODS (GEN 5) MISC: 1.0000 | 3 refills | Status: DC

## 2023-02-02 NOTE — Progress Notes (Signed)
Outpatient Endocrinology Note [place] Brynlei Klausner, MD   Patient's Name: Sean Buchanan    DOB: 04/23/1965    MRN: [id number]                                                    REASON OF VISIT: Follow up for type 2 diabetes mellitus  REFERRING PROVIDER:   PCP: Billie Lade, MD  HISTORY OF PRESENT ILLNESS:   Sean Buchanan is a 58 y.o. old male with past medical history listed below, is here for follow up for type 1 diabetes mellitus.   Pertinent Diabetes History: Patient was previously seen by Dr. Roosevelt Locks in December 2024, was following with primary care provider prior to that and presented for the follow-up of type 1 diabetes mellitus.  He has started on OmniPod 5 insulin pump therapy from the beginning of January 2025.  Patient states he was diagnosed with type 1 diabetes mellitus at the age of 16 years.  He was following with primary care provider and establish endocrinology care from the December 2024.  He has always been on insulin therapy since that diagnosis of diabetes.  He denies taking oral antidiabetic medication in the past.  He was on basal bolus regimen until December 2024, was taking Semglee 40 units 2 times a day and Humalog 15 units 3-4 times a day plus sliding scale.  Patient started on OmniPod 5 from the beginning of January, 2025, patient had training with diabetic educator.  Patient had type I autoimmune panel for GAD 65, IA 2 and insulin antibodies negative with low C-peptide 0.31 in December 2024 as follows.  Zinc transporter 8 antibody was not checked.   Latest Reference Range & Units 12/09/22 10:15  Glucose 65 - 99 mg/dL 086 (H)  IA-2 Antibody <5.4 U/mL <5.4  Insulin Antibodies, Human <0.4 U/mL <0.4  Glutamic Acid Decarb Ab <5 IU/mL <5  C-Peptide 0.80 - 3.85 ng/mL 0.31 (L)  (H): Data is abnormally high (L): Data is abnormally low  Patient states she was diagnosed as type 1 diabetes mellitus at the age of 25 and has been managed as type 1 diabetes mellitus, he has  always been on insulin therapy.  He has relatively required high dose of insulin, consistent with having high insulin resistance as well.  Chronic Diabetes Complications : Retinopathy: yes. Last ophthalmology exam was done on every 3-6 months, following with ophthalmology regularly.  Nephropathy: no, on ACE/ARB / lisinopril Peripheral neuropathy: no Coronary artery disease: no Stroke: yes in 2013.   Relevant comorbidities and cardiovascular risk factors: Obesity: yes Body mass index is 31.17 kg/m.  Hypertension: Yes  Hyperlipidemia : Yes, on statin.   Current / Home Diabetic regimen includes:  OmniPod 5 with Dexcom G7.  Using Humalog U100  Insulin Pump setting:  Basal MN- 3.4 u/hour  Bolus CHO Ratio (1unit:CHO) MN- 1:1  He has not been carb counting.  He has been using up to 20 carb for the larger meal, and less for the smaller meals.  Correction/Sensitivity: MN- 1:20   Target: 120-150    Active insulin time: 4 hours  Prior diabetic medications: Basal bolus regimen with Semglee 40 units 2 times a day and Humalog 15 units 3-4 times a day.  CONTINUOUS GLUCOSE MONITORING SYSTEM (CGMS) / INSULIN PUMP INTERPRETATION:  Not able to download pump in  the clinic today.  He has been using pump and Dexcom app in mobile phone.   Pump data reviewed from the phone app directly, total daily dose variable 36 units to 110 units/day.                      Glycemic data:    CONTINUOUS GLUCOSE MONITORING SYSTEM (CGMS) INTERPRETATION: At today's visit, we reviewed CGM downloads. The full report is scanned in the media. Reviewing the CGM trends, blood glucose are as follows:  Dexcom G7 CGM-  Sensor Download (Sensor download was reviewed and summarized below.) Dates: January 17 to  February 02, 2023  Glucose Management Indicator: 7.1%    Interpretation: -Mostly acceptable blood sugar with occasional mild hyperglycemia with blood sugar up to 200 range at different times including at  bedtime.  Blood sugar in between the meals are acceptable.  Rare hypoglycemia related to correctional insulin boluses.  Hypoglycemia: Patient has rare minor hypoglycemic episodes. Patient has hypoglycemia awareness.    Factors modifying glucose control: 1.  Diabetic diet assessment: 3 meals a day, occasionally snack at bedtime/overnight.  2.  Staying active or exercising:   3.  Medication compliance: compliant all of the time.  Interval history  Patient started to use OmniPod 5, he had training with diabetic educator.  CGM data and pump data as reviewed and noted above.  Denies complaints of numbness and tingling of the feet.  No other complaints today.  REVIEW OF SYSTEMS As per history of present illness.   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   Qualifier: Diagnosis of  By: Doreene Nest LPN, Jaime     Hyperlipidemia    Hypertension    IDDM (insulin dependent diabetes mellitus) 1983   AGE 59   Retinopathy, DIABETIC    Stroke (HCC) 04/20/2011   left facial weakness, and left hemiparesis    PAST SURGICAL HISTORY: Past Surgical History:  Procedure Laterality Date   CATARACT EXTRACTION  2024   COLONOSCOPY N/A 08/30/2016   Procedure: COLONOSCOPY;  Surgeon: West Bali, MD;  [facility];  Service: Endoscopy;  Laterality: N/A;  12:00pm   COLONOSCOPY WITH PROPOFOL N/A 01/11/2022   Procedure: COLONOSCOPY WITH PROPOFOL;  Surgeon: Lanelle Bal, DO;  Location: [facility];  Service: Endoscopy;  Laterality: N/A;  1:00 pm, pt knows to arrive  at 7:30   ESOPHAGOGASTRODUODENOSCOPY N/A 08/30/2016   Procedure: ESOPHAGOGASTRODUODENOSCOPY (EGD);  Surgeon: West Bali, MD;  Location: [facility];  Service: Endoscopy;  Laterality: N/A;   EYE SURGERY  01/03/2010   laser surgery to both eyes in July 18 and 26 , 2012   EYE SURGERY  2024   KNEE ARTHROSCOPY  1991 approx   POLYPECTOMY  01/11/2022   Procedure: POLYPECTOMY;  Surgeon: Lanelle Bal, DO;  Location: [facility];  Service: Endoscopy;;    ALLERGIES: No Known Allergies  FAMILY HISTORY:  Family History  Problem Relation Age of Onset   Diabetes Mother    Hyperlipidemia Mother  Hypertension Mother    Rosacea Mother    GER disease Father    Hypertension Father    Colon polyps Father    Colon polyps Sister    Diabetes Sister    Prostate cancer Paternal Uncle    Colon cancer Neg Hx     SOCIAL HISTORY: Social History   Socioeconomic History   Marital status: Single    Spouse name: Not on file   Number of children: Not on file   Years of education: Not on file   Highest education level: Not on file  Occupational History   Occupation: EMT   Tobacco Use   Smoking status: Never   Smokeless tobacco: Never  Vaping Use   Vaping status: Never Used  Substance and Sexual Activity   Alcohol use: No   Drug use: No   Sexual activity: Not Currently  Other Topics Concern   Not on file  Social History Narrative   Lives with mom and dad   Cat 3 outside       Enjoy: tv- hbo max -sci fy      Diet: eats all food groups-- a lot of fast food    Caffeine: soda daily 2 liters or more daily    Water: 2-4 cups       Wears seat belt    Does not use phone while driving   Smoke Conservator, museum/gallery -safe location    Social Drivers of Health   Financial Resource Strain: Low Risk  (08/10/2021)   Received from [facility], [facility]   Overall Financial Resource Strain (CARDIA)    Difficulty of Paying Living Expenses: Not hard at all   Food Insecurity: No Food Insecurity (08/10/2021)   Received from [facility], [facility]   Hunger Vital Sign    Worried About Running Out of Food in the Last Year: Never true    Ran Out of Food in the Last Year: Never true  Transportation Needs: No Transportation Needs (08/10/2021)   Received from [facility], [facility]   [facility] - Transportation    Lack of Transportation (Medical): No    Lack of Transportation (Non-Medical): No  Physical Activity: Insufficiently Active (11/13/2019)   Exercise Vital Sign    Days of Exercise per Week: 1 day    Minutes of Exercise per Session: 20 min  Stress: No Stress Concern Present (11/13/2019)   [employer] of Occupational [employer] - Occupational Stress Questionnaire    Feeling of Stress : Only a little  Social Connections: Unknown (05/18/2021)   Received from [facility], [facility]   Social Network    Social Network: Not on file    MEDICATIONS:  Current Outpatient Medications  Medication Sig Dispense Refill   atorvastatin (LIPITOR) 20 MG tablet TAKE 1 TABLET(20 MG) BY MOUTH DAILY 90 tablet 1   Continuous Blood Gluc Receiver (DEXCOM G7 RECEIVER) DEVI Use to check blood sugar. 1 each 1   Continuous Blood Gluc Sensor (DEXCOM G7 SENSOR) MISC Apply 1 sensor every 10 days. 3 each 12   Glucagon (BAQSIMI ONE PACK) 3 MG/DOSE POWD Place 1 Device into the nose as needed (Low blood sugar with impaired consciousness). 2 each 3   Insulin Disposable Pump (OMNIPOD 5 G7 INTRO, GEN 5,) KIT 1 kit by Does not apply route continuous. 1 kit 0   insulin glargine-yfgn (SEMGLEE, YFGN,) 100 UNIT/ML Pen Inject 40 Units into the skin 2 (two)  times daily. 72 mL 0   insulin lispro (HUMALOG) 100 UNIT/ML injection Inject 0.01-0.25 mLs (1-25 Units total) into the skin 3 (three) times daily with meals. 71 mL 0   lisinopril (ZESTRIL) 20 MG tablet TAKE 1 TABLET(20 MG) BY MOUTH DAILY 90 tablet 1   ondansetron (ZOFRAN) 4 MG tablet TAKE 1 TABLET(4 MG) BY  MOUTH EVERY 8 HOURS AS NEEDED FOR NAUSEA OR VOMITING 20 tablet 0   tadalafil (CIALIS) 20 MG tablet Take 0.5-1 tablets (10-20 mg total) by mouth every other day as needed for erectile dysfunction. 10 tablet 11   Continuous Glucose Sensor (DEXCOM G7 SENSOR) MISC 1 Device by Does not apply route continuous. 9 each 3   Insulin Disposable Pump (OMNIPOD 5 G7 PODS, GEN 5,) MISC 1 each by Does not apply route continuous. 30 each 3   No current facility-administered medications for this visit.    PHYSICAL EXAM: Vitals:   02/02/23 1040  BP: 138/60  Pulse: 75  Resp: 20  SpO2: 96%  Weight: 229 lb 12.8 oz (104.2 kg)  Height: 6' (1.829 m)   Body mass index is 31.17 kg/m.  Wt Readings from Last 3 Encounters:  02/02/23 229 lb 12.8 oz (104.2 kg)  01/03/23 226 lb (102.5 kg)  12/12/22 227 lb 12.8 oz (103.3 kg)    General: Well developed, well nourished male in no apparent distress.  HEENT: AT/[place], no external lesions.  Eyes: Conjunctiva clear and no icterus. Neck: Neck supple  Lungs: Respirations not labored Neurologic: Alert, oriented, normal speech Extremities / Skin: Dry. No sores or rashes noted.  Psychiatric: Does not appear depressed or anxious  Diabetic Foot Exam - Simple   No data filed     LABS Reviewed Lab Results  Component Value Date   HGBA1C 8.2 (H) 12/09/2022   HGBA1C 8.4 (H) 10/18/2022   HGBA1C 8.0 (H) 08/03/2021   No results found for: "FRUCTOSAMINE" Lab Results  Component Value Date   CHOL 198 12/09/2022   HDL 69 12/09/2022   LDLCALC 115 (H) 12/09/2022   TRIG 54 12/09/2022   CHOLHDL 2.9 12/09/2022   Lab Results  Component Value Date   MICRALBCREAT 8 12/09/2022   MICRALBCREAT 13 11/02/2021   Lab Results  Component Value Date   CREATININE 0.86 12/09/2022   No results found for: "GFR"  ASSESSMENT / PLAN  1. Uncontrolled type 1 diabetes mellitus with hyperglycemia (HCC)   2. 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 - Diabetic status / severity: Uncontrolled.  Lab Results  Component Value Date   HGBA1C 8.2 (H) 12/09/2022    - Hemoglobin A1c goal <6.5%   GMI on CGM 7.1%, diabetic control has been improving.  He is currently on OmniPod 5 with Dexcom G7 is started from early January 2025, using Humalog U100.  CGM data blood sugar has been mostly acceptable.  He tends to have occasional hypoglycemia after meal boluses, advised to use 5-15 of carb count for the meal bolus.  He does not know exact carb counting.  - Medications:  Insulin pump setting : No change in the setting today.  Historically he has required high dose of insulin, also has component of insulin resistance.  - Home glucose testing: continue CGM and check blood glucose as needed.  - Discussed/ Gave Hypoglycemia treatment plan.  # Consult : not required at this time.   # Annual urine for  microalbuminuria/ creatinine ratio, no microalbuminuria currently, continue ACE/ARB /lisinopril. Last  Lab Results  Component Value Date   MICRALBCREAT 8 12/09/2022    # Foot check nightly.  # Diabetic retinopathy, regularly following with retina specialist/ophthalmology.  - Diet: Make healthy diabetic food choices - Life style / activity / exercise: Discussed.  2. Blood pressure  -  BP Readings from Last 1 Encounters:  02/02/23 138/60    - Control is in target.  - No change in current plans.  3. Lipid status / Hyperlipidemia - Last  Lab Results  Component Value Date   LDLCALC 115 (H) 12/09/2022   - Continue atorvastatin 20 mg daily, managed by primary care provider.  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 -     Continuous Glucose Sensor (DEXCOM G7 SENSOR) MISC; 1 Device by Does not apply route continuous.    DISPOSITION Follow up in clinic in 3  months suggested.  Asked to call our clinic in between the visits with any questions regarding diabetes.   All questions answered and patient verbalized understanding of the plan.  [place] Sol Englert, MD [facility] Endocr[facility]ology [facility] [address] [place], [address] [place], [place] [address] Phone # [phone]  At least part of this note was generated using voice recognition software. Inadvertent word errors may have occurred, which were not recognized during the proofreading process.

## 2023-02-03 ENCOUNTER — Encounter: Payer: Self-pay | Admitting: Endocrinology

## 2023-02-03 ENCOUNTER — Other Ambulatory Visit: Payer: Self-pay

## 2023-02-03 DIAGNOSIS — E103553 Type 1 diabetes mellitus with stable proliferative diabetic retinopathy, bilateral: Secondary | ICD-10-CM

## 2023-02-03 MED ORDER — OMNIPOD 5 G7 PODS (GEN 5) MISC: 1.0000 | 3 refills | Status: DC

## 2023-03-10 ENCOUNTER — Ambulatory Visit: Payer: Self-pay | Admitting: Internal Medicine

## 2023-03-10 NOTE — Telephone Encounter (Signed)
 Patient advised.

## 2023-03-10 NOTE — Telephone Encounter (Signed)
   Chief Complaint: vomiting Symptoms: episodic vomiting since Saturday - decreased intake- fluids-sips, not eating, weakness, dark urine Frequency: 6days Pertinent Negatives: Patient denies fever, headache, vertigo, vomiting blood or coffee grounds, recent head injury Disposition: [x] ED /[] Urgent Care (no appt availability in office) / [] Appointment(In office/virtual)/ []  [place]/ [] Home Care/ [] Refused Recommended Disposition /[]  Mobile Bus/ []  Follow-up with PCP Additional Notes: Patient PCP is not avaiable- declines to see another provider- ED advised due to dehydration status    Copied from CRM #[id number]. Topic: Clinical - Red Word Triage >> Mar 10, 2023  8:15 AM Tiffany B wrote: Red Word that prompted transfer to Nurse Triage: Patient is vomiting up bile. Reason for Disposition  [1] Drinking very little AND [2] dehydration suspected (e.g., no urine > 12 hours, very dry mouth, very lightheaded)  Answer Assessment - Initial Assessment Questions 1. VOMITING SEVERITY: "How many times have you vomited in the past 24 hours?"     - MILD:  1 - 2 times/day    - MODERATE: 3 - 5 times/day, decreased oral intake without significant weight loss or symptoms of dehydration    - SEVERE: 6 or more times/day, vomits everything or nearly everything, with significant weight loss, symptoms of dehydration      Vomiting yellow bile, mild 2. ONSET: "When did the vomiting begin?"      Saturday am- started, vomited every day 3. FLUIDS: "What fluids or food have you vomited up today?" "Have you been able to keep any fluids down?"     No fluids today, small sips throughout the week, not eating-did eat couple times 4. ABDOMEN PAIN: "Are your having any abdomen pain?" If Yes : "How bad is it and what does it feel like?" (e.g., crampy, dull, intermittent, constant)      no 5. DIARRHEA: "Is there any diarrhea?" If Yes, ask: "How many times today?"      no 6. CONTACTS: "Is there anyone  else in the family with the same symptoms?"      Paramedic  7. CAUSE: "What do you think is causing your vomiting?"     Not sure 8. HYDRATION STATUS: "Any signs of dehydration?" (e.g., dry mouth [not only dry lips], too weak to stand) "When did you last urinate?"     Urine dark, weakness 9. OTHER SYMPTOMS: "Do you have any other symptoms?" (e.g., fever, headache, vertigo, vomiting blood or coffee grounds, recent head injury)     weakness  Protocols used: Vomiting-A-AH

## 2023-04-18 ENCOUNTER — Ambulatory Visit: Payer: Commercial Managed Care - PPO | Admitting: Internal Medicine

## 2023-05-08 ENCOUNTER — Encounter: Payer: Self-pay | Admitting: Endocrinology

## 2023-05-08 ENCOUNTER — Ambulatory Visit (INDEPENDENT_AMBULATORY_CARE_PROVIDER_SITE_OTHER): Payer: Commercial Managed Care - PPO | Admitting: Endocrinology

## 2023-05-08 ENCOUNTER — Telehealth: Payer: Self-pay | Admitting: Nutrition

## 2023-05-08 VITALS — BP 120/74 | HR 80 | Ht 72.0 in | Wt 227.4 lb

## 2023-05-08 DIAGNOSIS — E103553 Type 1 diabetes mellitus with stable proliferative diabetic retinopathy, bilateral: Secondary | ICD-10-CM | POA: Diagnosis not present

## 2023-05-08 LAB — POCT GLYCOSYLATED HEMOGLOBIN (HGB A1C): Hemoglobin A1C: 7.1 % — AB (ref 4.0–5.6)

## 2023-05-08 MED ORDER — DEXCOM G7 SENSOR MISC
3 refills | Status: AC
Start: 2023-05-08 — End: ?

## 2023-05-08 MED ORDER — LYUMJEV 100 UNIT/ML IJ SOLN: INTRAMUSCULAR | 4 refills | Status: DC

## 2023-05-08 NOTE — Telephone Encounter (Signed)
 LVM to call me this morning asap, so that we can like him to see his pump data.  If he can not call before appointment he was told to come in 20 minutes early to do this before seeing Dr. Aretha Kubas

## 2023-05-08 NOTE — Patient Instructions (Addendum)
 Latest Reference Range & Units 06/24/20 09:11 08/03/21 10:59 10/18/22 08:54 12/09/22 10:15 05/08/23 09:24  Hemoglobin A1C 4.0 - 5.6 % -  8.0 (H) 8.0 (H) 8.4 (H) 8.2 (H) 7.1 ! Pend  (H): Data is abnormally high !: Data is abnormal

## 2023-05-08 NOTE — Progress Notes (Signed)
 Outpatient Endocrinology Note [place] Royer Cristobal, MD   Patient's Name: Sean Buchanan    DOB: [birth year]    MRN: [id number]                                                    REASON OF VISIT: Follow up for type 1 diabetes mellitus  REFERRING PROVIDER:   PCP: Tobi Fortes, MD  HISTORY OF PRESENT ILLNESS:   Sean Buchanan is a 58 y.o. old male with past medical history listed below, is here for follow up for type 1 diabetes mellitus.   Pertinent Diabetes History: Patient was previously seen by Dr. Vertell Gory in December 2024, was following with primary care provider prior to that and presented for the follow-up of type 1 diabetes mellitus.  He has started on OmniPod 5 insulin  pump therapy from the beginning of January 2025.  Patient states he was diagnosed with type 1 diabetes mellitus at the age of 16 years.  He was following with primary care provider and establish endocrinology care from the December 2024.  He has always been on insulin  therapy since that diagnosis of diabetes.  He denies taking oral antidiabetic medication in the past.  He was on basal bolus regimen until December 2024, was taking Semglee  40 units 2 times a day and Humalog  15 units 3-4 times a day plus sliding scale.  Patient was started on OmniPod 5 from the beginning of January, 2025, patient had training with diabetic educator.  Patient had type I autoimmune panel for GAD 65, IA 2 and insulin  antibodies negative with low C-peptide 0.31 in December 2024 as follows.  Zinc transporter 8 antibody was not checked.   Latest Reference Range & Units 12/09/22 10:15  Glucose 65 - 99 mg/dL 962 (H)  IA-2 Antibody <5.4 U/mL <5.4  Insulin  Antibodies, Human <0.4 U/mL <0.4  Glutamic Acid Decarb Ab <5 IU/mL <5  C-Peptide 0.80 - 3.85 ng/mL 0.31 (L)  (H): Data is abnormally high (L): Data is abnormally low  Patient states he was diagnosed as type 1 diabetes mellitus at the age of 68 and has been managed as type 1 diabetes mellitus,  he has always been on insulin  therapy.  He has relatively required high dose of insulin , consistent with having high insulin  resistance as well.  Chronic Diabetes Complications : Retinopathy: yes. Last ophthalmology exam was done on every 3-6 months, following with ophthalmology regularly.  Nephropathy: no, on ACE/ARB / lisinopril  Peripheral neuropathy: no Coronary artery disease: no Stroke: yes in 2013.   Relevant comorbidities and cardiovascular risk factors: Obesity: yes Body mass index is 30.84 kg/m.  Hypertension: Yes  Hyperlipidemia : Yes, on statin.   Current / Home Diabetic regimen includes:  OmniPod 5 with Dexcom G7.  Using Humalog  U100  Insulin  Pump setting:  Basal MN- 3.0 u/hour  Bolus CHO Ratio (1unit:CHO) MN- 1:1  Use carb count 5-15 range based on meal size.  Correction/Sensitivity: MN- 1:20   Target: 120-150    Active insulin  time: 4 hours  Prior diabetic medications: Basal bolus regimen with Semglee  40 units 2 times a day and Humalog  15 units 3-4 times a day.  CONTINUOUS GLUCOSE MONITORING SYSTEM (CGMS) / INSULIN  PUMP INTERPRETATION:  OmniPod 5 Pump & Sensor Download (Reviewed and summarized below.)  Dates: April 23 to May 09, 2023, 14  days   Average total daily insulin :  55 units, Basal: 74%, Food Bolus: 26%  Automated mode 99%  GMI on Dexcom 7.9%.  CGM uses 91%     Trends:  Frequent hyperglycemia with blood sugar in the range of 200-250 postprandially mostly related to late and not enough meal bolus and occasionally with no meal boluses.  Some of the times acceptable blood sugar throughout the day and in between the meals.  Blood sugar overnight acceptable.  No hypoglycemia.  Hypoglycemia: Patient has no hypoglycemic episodes. Patient has hypoglycemia awareness.    Factors modifying glucose control: 1.  Diabetic diet assessment: 3 meals a day, occasionally snack at bedtime/overnight.  2.  Staying active or exercising:   3.  Medication  compliance: compliant all of the time.  Interval history  Hemoglobin A1c improved to 7.1%, congratulated him.  Recent CGM data with frequent hyperglycemia postprandially as reviewed above.  Pump data reviewed as above.  He reports he is slowly gaining weight.  No other complaints today.  REVIEW OF SYSTEMS As per history of present illness.   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   Qualifier: Diagnosis of  By: Lorita Rosa LPN, Jaime     Hyperlipidemia    Hypertension    IDDM (insulin  dependent diabetes mellitus) 1983   AGE 57   Retinopathy, DIABETIC    Stroke (HCC) 04/20/2011   left facial weakness, and left hemiparesis    PAST SURGICAL HISTORY: Past Surgical History:  Procedure Laterality Date   CATARACT EXTRACTION  2024   COLONOSCOPY N/A 08/30/2016   Procedure: COLONOSCOPY;  Surgeon: Alyce Jubilee, MD;  [facility];  Service: Endoscopy;  Laterality: N/A;  12:00pm   COLONOSCOPY WITH PROPOFOL  N/A 01/11/2022   Procedure: COLONOSCOPY WITH PROPOFOL ;  Surgeon: Vinetta Greening, DO;  Location: [facility];  Service: Endoscopy;  Laterality: N/A;  1:00 pm, pt knows to arrive at 7:30   ESOPHAGOGASTRODUODENOSCOPY N/A 08/30/2016   Procedure: ESOPHAGOGASTRODUODENOSCOPY (EGD);  Surgeon: Alyce Jubilee, MD;  Location: [facility];  Service: Endoscopy;  Laterality: N/A;   EYE SURGERY  01/03/2010   laser surgery to both eyes in July 18 and 26 , 2012   EYE SURGERY  2024   KNEE ARTHROSCOPY  1991 approx   POLYPECTOMY  01/11/2022   Procedure:  POLYPECTOMY;  Surgeon: Vinetta Greening, DO;  [facility]: [facility];  Service: Endoscopy;;    ALLERGIES: No Known Allergies  FAMILY HISTORY:  Family History  Problem Relation Age of Onset   Diabetes Mother    Hyperlipidemia Mother    Hypertension Mother    Rosacea Mother    GER disease Father    Hypertension Father    Colon polyps Father    Colon polyps Sister    Diabetes Sister    Prostate cancer Paternal Uncle    Colon cancer Neg Hx     SOCIAL HISTORY: Social History   Socioeconomic History   Marital status: Single    Spouse name: Not on file  Number of children: Not on file   Years of education: Not on file   Highest education level: Not on file  Occupational History   Occupation: EMT   Tobacco Use   Smoking status: Never   Smokeless tobacco: Never  Vaping Use   Vaping status: Never Used  Substance and Sexual Activity   Alcohol use: No   Drug use: No   Sexual activity: Not Currently  Other Topics Concern   Not on file  Social History Narrative   Lives with mom and dad   Cat 3 outside       Enjoy: tv- [employer] max -sci fy      Diet: eats all food groups-- a lot of fast food    Caffeine: soda daily 2 liters or more daily    Water : 2-4 cups       Wears seat belt    Does not use phone while driving   Smoke Conservator, museum/gallery -safe location    Social Drivers of Health   Financial Resource Strain: Low Risk  (08/10/2021)   [facility], [facility]   Overall Financial Resource Strain (CARDIA)    Difficulty of Paying Living Expenses: Not hard at all  Food Insecurity: No Food Insecurity (08/10/2021)   Received from [facility], [facility]   Hunger Vital Sign    Worried About Running Out of Food in the Last Year: Never true    Ran Out of Food in the Last Year: Never true  Transportation Needs: No Transportation Needs (08/10/2021)   [facility] from [facility]   [facility] - Transportation     Lack of Transportation (Medical): No    Lack of Transportation (Non-Medical): No  Physical Activity: Insufficiently Active (11/13/2019)   Exercise Vital Sign    Days of Exercise per Week: 1 day    Minutes of Exercise per Session: 20 min  Stress: No Stress Concern Present (11/13/2019)   [employer] [facility] [employer] - Occupational Stress Questionnaire    Feeling of Stress : Only a little  Social Connections: Unknown (05/18/2021)   Received from [facility], [facility]   Social Network    Social Network: Not on file    MEDICATIONS:  Current Outpatient Medications  Medication Sig Dispense Refill   atorvastatin  (LIPITOR) 20 MG tablet TAKE 1 TABLET(20 MG) BY MOUTH DAILY 90 tablet 1   Continuous Blood Gluc Receiver (DEXCOM G7 RECEIVER) DEVI Use to check blood sugar. 1 each 1   Continuous Glucose Sensor (DEXCOM G7 SENSOR) MISC 1 Device by Does not apply route continuous. 9 each 3   Glucagon  (BAQSIMI  ONE PACK) 3 MG/DOSE POWD Place 1 Device into the nose as needed (Low blood sugar with impaired consciousness). 2 each 3   Insulin  Disposable Pump (OMNIPOD 5 G7 INTRO, GEN 5,) KIT 1 kit by Does not apply route continuous. 1 kit 0   Insulin  Disposable Pump (OMNIPOD 5 G7 PODS, GEN 5,) MISC 1 each by Does not apply route continuous. Change pod every 3 days as directed 30 each 3   Insulin  Lispro-aabc (LYUMJEV ) 100 UNIT/ML SOLN Take up to 80 units/day via insulin  pump. 50 mL 4   lisinopril  (ZESTRIL ) 20 MG tablet TAKE 1 TABLET(20 MG) BY MOUTH DAILY 90 tablet 1   ondansetron  (ZOFRAN ) 4 MG tablet TAKE 1 TABLET(4 MG) BY MOUTH EVERY 8 HOURS AS NEEDED FOR NAUSEA OR VOMITING 20 tablet 0  Continuous Glucose Sensor (DEXCOM G7 SENSOR) MISC Apply 1 sensor every 10 days. 9 each 3   insulin  lispro (HUMALOG ) 100 UNIT/ML injection Inject 0.01-0.25 mLs (1-25 Units total) into the skin 3 (three) times daily with meals. 71 mL 0   tadalafil  (CIALIS ) 20 MG tablet Take 0.5-1 tablets (10-20 mg total) by mouth  every other day as needed for erectile dysfunction. (Patient not taking: Reported on 05/08/2023) 10 tablet 11   No current facility-administered medications for this visit.    PHYSICAL EXAM: Vitals:   05/08/23 0913  BP: 120/74  Pulse: 80  SpO2: 98%  Weight: 227 lb 6.4 oz (103.1 kg)  Height: 6' (1.829 m)   Body mass index is 30.84 kg/m.  Wt Readings from Last 3 Encounters:  05/08/23 227 lb 6.4 oz (103.1 kg)  02/02/23 229 lb 12.8 oz (104.2 kg)  01/03/23 226 lb (102.5 kg)    General: Well developed, well nourished male in no apparent distress.  HEENT: AT/[place], no external lesions.  Eyes: Conjunctiva clear and no icterus. Neck: Neck supple  Lungs: Respirations not labored Neurologic: Alert, oriented, normal speech Extremities / Skin: Dry.   Psychiatric: Does not appear depressed or anxious  Diabetic Foot Exam - Simple   No data filed     LABS Reviewed Lab Results  Component Value Date   HGBA1C 7.1 (A) 05/08/2023   HGBA1C 8.2 (H) 12/09/2022   HGBA1C 8.4 (H) 10/18/2022   No results found for: "FRUCTOSAMINE" Lab Results  Component Value Date   CHOL 198 12/09/2022   HDL 69 12/09/2022   LDLCALC 115 (H) 12/09/2022   TRIG 54 12/09/2022   CHOLHDL 2.9 12/09/2022   Lab Results  Component Value Date   MICRALBCREAT 8 12/09/2022   MICRALBCREAT 13 11/02/2021   Lab Results  Component Value Date   CREATININE 0.86 12/09/2022   No results found for: "GFR"  ASSESSMENT / PLAN  1. 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 - Diabetic status / severity: Uncontrolled.  Lab Results  Component Value Date   HGBA1C 7.1 (A) 05/08/2023    - Hemoglobin A1c goal <6.5%   Diabetes control improved to 7.1% congratulated him.  CGM data is still with postprandial hyperglycemia.  Discussed about meal bolus 10 to 15 minutes before eating, count 5-15 range.  Due to his  nature of work, he is not able to meal bolus 15 minutes before eating most of the time.  I would like to change insulin  from Humalog  to Lyumjev  (faster acting).  - Medications:  Insulin  pump setting : No change in the setting today.  Historically he has required high dose of insulin , also has component of insulin  resistance.  Discussed about using metformin to help with insulin  sensitivity.  Will consider in the future.  Patient does not want to be on metformin at this time, due to concern of especially GI related side effects.  - Home glucose testing: continue CGM and check blood glucose as needed.  Dexcom G7. - Discussed/ Gave Hypoglycemia treatment plan.  # Consult : not required at this time.   # Annual urine for microalbuminuria/ creatinine ratio, no microalbuminuria currently, continue ACE/ARB /lisinopril . Last  Lab Results  Component Value Date   MICRALBCREAT 8 12/09/2022    # Foot check nightly.  # Diabetic retinopathy, regularly following with retina specialist/ophthalmology.  - Diet: Make healthy diabetic food choices - Life style /  activity / exercise: Discussed.  2. Blood pressure  -  BP Readings from Last 1 Encounters:  05/08/23 120/74    - Control is in target.  - No change in current plans.  3. Lipid status / Hyperlipidemia - Last  Lab Results  Component Value Date   LDLCALC 115 (H) 12/09/2022   - Continue atorvastatin  20 mg daily, managed by primary care provider.  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 -     Continuous Glucose Sensor (DEXCOM G7 SENSOR) MISC; Apply 1 sensor every 10 days.    DISPOSITION Follow up in clinic in 3  months suggested.    All questions answered and patient verbalized understanding of the  plan.  [place] Kathreen Dileo, MD [facility] Endocrinology [facility] [address] [place], [address] [place], [place] [address] Phone # [phone]  At least part of this note was generated using voice recognition software. Inadvertent word errors may have occurred, which were not recognized during the proofreading process.

## 2023-05-08 NOTE — Telephone Encounter (Signed)
 Patient came in early to link to glooko: User name: [email or web address]    Password: [email or web address] 

## 2023-05-22 ENCOUNTER — Telehealth: Payer: Self-pay

## 2023-05-22 NOTE — Telephone Encounter (Signed)
 Patient left VM stating he had some questions regarding his Lyumjev . RN called back no answer, VM left for patient.

## 2023-07-19 ENCOUNTER — Other Ambulatory Visit (HOSPITAL_COMMUNITY): Payer: Self-pay

## 2023-08-23 ENCOUNTER — Encounter: Payer: Self-pay | Admitting: Endocrinology

## 2023-08-23 ENCOUNTER — Ambulatory Visit (INDEPENDENT_AMBULATORY_CARE_PROVIDER_SITE_OTHER): Admitting: Endocrinology

## 2023-08-23 ENCOUNTER — Ambulatory Visit: Payer: Self-pay | Admitting: Endocrinology

## 2023-08-23 VITALS — BP 128/60 | HR 69 | Resp 20 | Ht 72.0 in | Wt 223.8 lb

## 2023-08-23 DIAGNOSIS — E103553 Type 1 diabetes mellitus with stable proliferative diabetic retinopathy, bilateral: Secondary | ICD-10-CM

## 2023-08-23 LAB — POCT GLYCOSYLATED HEMOGLOBIN (HGB A1C): Hemoglobin A1C: 7.7 % — AB (ref 4.0–5.6)

## 2023-08-23 MED ORDER — FIASP 100 UNIT/ML IJ SOLN
INTRAMUSCULAR | 3 refills | Status: DC
Start: 2023-08-23 — End: 2023-08-28

## 2023-08-23 NOTE — Progress Notes (Signed)
 Outpatient Endocrinology Note [place] Arpita Fentress, MD   Patient's Name: Sean Buchanan    DOB: 11/19/1965    MRN: [id number]                                                    REASON OF VISIT: Follow up for type 1 diabetes mellitus  PCP: Patient, No Pcp Per  HISTORY OF PRESENT ILLNESS:   Sean Buchanan is a 58 y.o. old male with past medical history listed below, is here for follow up for type 1 diabetes mellitus.   Pertinent Diabetes History: Patient was previously seen by Dr. Dartha in December 2024, was following with primary care provider prior to that and presented for the follow-up of type 1 diabetes mellitus.  He has started on OmniPod 5 insulin  pump therapy from the beginning of January 2025.  Patient states he was diagnosed with type 1 diabetes mellitus at the age of 16 years.  He was following with primary care provider and establish endocrinology care from the December 2024.  He has always been on insulin  therapy since that diagnosis of diabetes.  He denies taking oral antidiabetic medication in the past.  He was on basal bolus regimen until December 2024, was taking Semglee  40 units 2 times a day and Humalog  15 units 3-4 times a day plus sliding scale.  Patient was started on OmniPod 5 from the beginning of January, 2025, patient had training with diabetic educator.  Patient had type I autoimmune panel for GAD 65, IA 2 and insulin  antibodies negative with low C-peptide 0.31 in December 2024 as follows.  Zinc transporter 8 antibody was not checked.   Latest Reference Range & Units 12/09/22 10:15  Glucose 65 - 99 mg/dL 895 (H)  IA-2 Antibody <5.4 U/mL <5.4  Insulin  Antibodies, Human <0.4 U/mL <0.4  Glutamic Acid Decarb Ab <5 IU/mL <5  C-Peptide 0.80 - 3.85 ng/mL 0.31 (L)  (H): Data is abnormally high (L): Data is abnormally low  Patient states he was diagnosed as type 1 diabetes mellitus at the age of 22 and has been managed as type 1 diabetes mellitus, he has always been on insulin   therapy.  He has relatively required high dose of insulin , consistent with having high insulin  resistance as well.  Chronic Diabetes Complications : Retinopathy: yes. Last ophthalmology exam was done on every 3-6 months, following with ophthalmology regularly.  Nephropathy: no, on ACE/ARB / lisinopril  Peripheral neuropathy: no Coronary artery disease: no Stroke: yes in 2013.   Relevant comorbidities and cardiovascular risk factors: Obesity: yes Body mass index is 30.35 kg/m.  Hypertension: Yes  Hyperlipidemia : Yes, on statin.   Current / Home Diabetic regimen includes:  OmniPod 5 with Dexcom G7.  Using Humalog  U100 /Lyumjev  U100  Insulin  Pump setting:  Basal MN- 3.0 u/hour  Bolus CHO Ratio (1unit:CHO) MN- 1:1  Use carb count 5-15 range based on meal size.  Correction/Sensitivity: MN- 1:20   Target: 120-150    Active insulin  time: 4 hours  Prior diabetic medications: Basal bolus regimen with Semglee  40 units 2 times a day and Humalog  15 units 3-4 times a day.  CONTINUOUS GLUCOSE MONITORING SYSTEM (CGMS) / INSULIN  PUMP INTERPRETATION:  OmniPod 5 Pump & Sensor Download (Reviewed and summarized below.)  Dates: August 7 - August 20 , 2025, 14 days  Average total daily insulin :  58 units, Basal: 74%, Food Bolus: 26%  Automated mode 93%, manual mode 7%  GMI on Dexcom 7.5%.  CGM uses 91%      Trends:  Frequent hyperglycemia related to no meal bolus and sometimes not enough meal bolus.  Hyperglycemia in the range of 200-250s.  Most of the other times acceptable blood sugar overnight and in between the meals.  He tends to have hypoglycemia on manual mode related to high basal rate.  Hypoglycemia: Patient has minor hypoglycemic episodes. Patient has hypoglycemia awareness.    Factors modifying glucose control: 1.  Diabetic diet assessment: 3 meals a day, occasionally snack at bedtime/overnight.  2.  Staying active or exercising:   3.  Medication compliance:  compliant all of the time.  Interval history  Pump and CGM data as reviewed above.  Hemoglobin A1c worsened to 7.7%.  Patient reports she had burning sensation with the use of limb days and he went back to Humalog  to using the pump.  He is having mostly postprandial hyperglycemia.  He lost about 7 pounds of weight in the last few months.  No other complaints today.  REVIEW OF SYSTEMS As per history of present illness.   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   Qualifier: Diagnosis of  By: Jodene LPN, Jaime     Hyperlipidemia    Hypertension    IDDM (insulin  dependent diabetes mellitus) 1983   AGE 71   Retinopathy, DIABETIC    Stroke (HCC) 04/20/2011   left facial weakness, and left hemiparesis    PAST SURGICAL HISTORY: Past Surgical History:  Procedure Laterality Date   CATARACT EXTRACTION  2024   COLONOSCOPY N/A 08/30/2016   Procedure: COLONOSCOPY;  Surgeon: Harvey Margo CROME, MD;  [facility];  Service: Endoscopy;  Laterality: N/A;  12:00pm   COLONOSCOPY WITH PROPOFOL  N/A 01/11/2022   Procedure: COLONOSCOPY WITH PROPOFOL ;  Surgeon: Cindie Carlin POUR, DO;  [facility];  Service: Endoscopy;  Laterality: N/A;  1:00 pm, pt knows to arrive at 7:30   ESOPHAGOGASTRODUODENOSCOPY N/A 08/30/2016   Procedure: ESOPHAGOGASTRODUODENOSCOPY (EGD);  Surgeon: Harvey Margo CROME, MD;  [facility];  Service: Endoscopy;  Laterality: N/A;   EYE SURGERY  01/03/2010   laser surgery to both eyes in July 18 and 26 , 2012   EYE SURGERY  2024    KNEE ARTHROSCOPY  1991 approx   POLYPECTOMY  01/11/2022   Procedure: POLYPECTOMY;  Surgeon: Cindie Carlin POUR, DO;  [facility];  Service: Endoscopy;;    ALLERGIES: Allergies  Allergen Reactions   Lyumjev  [Insulin  Lispro] Other (See Comments)    Irritation to the arm causing discomfort and burning    FAMILY HISTORY:  Family History  Problem Relation Age of Onset   Diabetes Mother    Hyperlipidemia Mother    Hypertension Mother    Rosacea Mother    GER disease Father    Hypertension Father    Colon polyps Father    Colon polyps  Sister    Diabetes Sister    Prostate cancer Paternal Uncle    Colon cancer Neg Hx     SOCIAL HISTORY: Social History   Socioeconomic History   Marital status: Single    Spouse name: Not on file   Number of children: Not on file   Years of education: Not on file   Highest education level: Not on file  Occupational History   Occupation: EMT   Tobacco Use   Smoking status: Never   Smokeless tobacco: Never  Vaping Use   Vaping status: Never Used  Substance and Sexual Activity   Alcohol use: No   Drug use: No   Sexual activity: Not Currently  Other Topics Concern   Not on file  Social History Narrative   Lives with mom and dad   Cat 3 outside       Enjoy: tv- h[employer] max -sci fy      Diet: eats all food groups-- a lot of fast food    Caffeine: soda daily 2 liters or more daily    Water : 2-4 cups       Wears seat belt    Does not use phone while driving   Smoke Conservator, museum/gallery -safe location    Social Drivers of Health   Financial Resource Strain: Low Risk  (08/10/2021)   Received from [facility]   Overall Financial Resource Strain (CARDIA)    Difficulty of Paying Living Expenses: Not hard at all  Food Insecurity: No Food Insecurity (08/10/2021)   Received from [facility]   Hunger Vital Sign    Within the past 12 months, you worried that your food would run out before you got the  money to buy more.: Never true    Within the past 12 months, the food you bought just didn't last and you didn't have money to get more.: Never true  Transportation Needs: No Transportation Needs (08/10/2021)   Received from [facility]   PRAPARE - Transportation    Lack of Transportation (Medical): No    Lack of Transportation (Non-Medical): No  Physical Activity: Insufficiently Active (11/13/2019)   Exercise Vital Sign    Days of Exercise per Week: 1 day    Minutes of Exercise per Session: 20 min  Stress: No Stress Concern Present (11/13/2019)   Sean Buchanan    Feeling of Stress : Only a little  Social Connections: Unknown (05/18/2021)   Received from [facility]   Social Network    Social Network: Not on file    MEDICATIONS:  Current Outpatient Medications  Medication Sig Dispense Refill   atorvastatin  (LIPITOR) 20 MG tablet TAKE 1 TABLET(20 MG) BY MOUTH DAILY 90 tablet 1   Continuous Blood Gluc Receiver (DEXCOM G7 RECEIVER) DEVI Use to check blood sugar. 1 each 1   Continuous Glucose Sensor (DEXCOM G7 SENSOR) MISC 1 Device by Does not apply route continuous. 9 each 3   Continuous Glucose Sensor (DEXCOM G7 SENSOR) MISC Apply 1 sensor every 10 days. 9 each 3   Glucagon  (BAQSIMI  ONE PACK) 3 MG/DOSE POWD Place 1 Device into the nose as needed (Low blood sugar with impaired consciousness). 2 each 3   Insulin  Aspart, w/Niacinamide, (FIASP ) 100 UNIT/ML SOLN Take up to 80 units / day via insulin  pump. 40 mL 3   Insulin  Disposable Pump (OMNIPOD 5 G7 INTRO, GEN 5,) KIT 1 kit by Does  not apply route continuous. 1 kit 0   Insulin  Disposable Pump (OMNIPOD 5 G7 PODS, GEN 5,) MISC 1 each by Does not apply route continuous. Change pod every 3 days as directed 30 each 3   insulin  lispro (HUMALOG ) 100 UNIT/ML injection Inject 0.01-0.25 mLs (1-25 Units total) into the skin 3 (three) times daily with meals. 71 mL 0   lisinopril   (ZESTRIL ) 20 MG tablet TAKE 1 TABLET(20 MG) BY MOUTH DAILY 90 tablet 1   ondansetron  (ZOFRAN ) 4 MG tablet TAKE 1 TABLET(4 MG) BY MOUTH EVERY 8 HOURS AS NEEDED FOR NAUSEA OR VOMITING 20 tablet 0   tadalafil  (CIALIS ) 20 MG tablet Take 0.5-1 tablets (10-20 mg total) by mouth every other day as needed for erectile dysfunction. 10 tablet 11   Insulin  Lispro-aabc (LYUMJEV ) 100 UNIT/ML SOLN Take up to 80 units/day via insulin  pump. (Patient not taking: Reported on 08/23/2023) 50 mL 4   No current facility-administered medications for this visit.    PHYSICAL EXAM: Vitals:   08/23/23 1420  BP: 128/60  Pulse: 69  Resp: 20  SpO2: 99%  Weight: 223 lb 12.8 oz (101.5 kg)  Height: 6' (1.829 m)    Body mass index is 30.35 kg/m.  Wt Readings from Last 3 Encounters:  08/23/23 223 lb 12.8 oz (101.5 kg)  05/08/23 227 lb 6.4 oz (103.1 kg)  02/02/23 229 lb 12.8 oz (104.2 kg)    General: Well developed, well nourished male in no apparent distress.  HEENT: AT/, no external lesions.  Eyes: Conjunctiva clear and no icterus. Neck: Neck supple  Lungs: Respirations not labored Neurologic: Alert, oriented, normal speech Extremities / Skin: Dry.   Psychiatric: Does not appear depressed or anxious  Diabetic Foot Exam - Simple   No data filed     LABS Reviewed Lab Results  Component Value Date   HGBA1C 7.7 (A) 08/23/2023   HGBA1C 7.1 (A) 05/08/2023   HGBA1C 8.2 (H) 12/09/2022   No results found for: FRUCTOSAMINE Lab Results  Component Value Date   CHOL 198 12/09/2022   HDL 69 12/09/2022   LDLCALC 115 (H) 12/09/2022   TRIG 54 12/09/2022   CHOLHDL 2.9 12/09/2022   Lab Results  Component Value Date   MICRALBCREAT 8 12/09/2022   MICRALBCREAT 13 11/02/2021   Lab Results  Component Value Date   CREATININE 0.86 12/09/2022   No results found for: GFR  ASSESSMENT / PLAN  1. 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 - Diabetic status / severity: Uncontrolled.  Worsening  Lab Results  Component Value Date   HGBA1C 7.7 (A) 08/23/2023    - Hemoglobin A1c goal <6.5%   Lyumjev  tried however history stopped due to burning sensation.  Will try fiasp , to use in the pump.  Patient is asked to bolus for all the meals prior to eating.  Patient is also asked to cut back on carbohydrate in the meals.  He is mainly having hyperglycemia related to the meals.  - Medications:  Insulin  pump setting : Change basal rate to avoid hypoglycemia when on manual mode.  OmniPod 5 with Dexcom G7.  Using Humalog  U100 /Lyumjev  U100, will change to Fiasp  U100  Insulin  Pump setting:  Basal MN- 3.0 u/hour, changed to 1.5  Bolus CHO Ratio (1unit:CHO) MN- 1:1  Use carb count 5-15 range based on meal size.  Correction/Sensitivity: MN- 1:20   Target: 120-150  Active insulin  time: 4 hours   Historically he has required high dose of insulin , also has component of insulin  resistance.  Discussed about using metformin to help with insulin  sensitivity.  Will consider in the future.  Patient does not want to be on metformin at this time, due to concern of especially GI related side effects.  - Home glucose testing: continue CGM and check blood glucose as needed.  Dexcom G7.  - Discussed/ Gave Hypoglycemia treatment plan.  # Consult : not required at this time.   # Annual urine for microalbuminuria/ creatinine ratio, no microalbuminuria currently, continue ACE/ARB /lisinopril . Last  Lab Results  Component Value Date   MICRALBCREAT 8 12/09/2022    # Foot check nightly.  # Diabetic retinopathy, regularly following with retina specialist/ophthalmology.  - Diet: Make healthy diabetic food choices - Life style / activity / exercise: Discussed.  2. Blood pressure  -  BP Readings from Last 1 Encounters:  08/23/23 128/60    - Control is in target.  - No change in  current plans.  3. Lipid status / Hyperlipidemia - Last  Lab Results  Component Value Date   LDLCALC 115 (H) 12/09/2022   - Continue atorvastatin  20 mg daily, managed by primary care provider.  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    DISPOSITION Follow up in clinic in 3  months suggested.    All questions answered and patient verbalized understanding of the plan.  [place] Virgie Chery, MD [facility] [address] [place], [address] [place], [place] [address] Phone # [phone]  At least part of this note was generated using voice recognition software. Inadvertent word errors may have occurred, which were not recognized during the proofreading process.

## 2023-08-28 ENCOUNTER — Other Ambulatory Visit: Payer: Self-pay | Admitting: Endocrinology

## 2023-08-28 ENCOUNTER — Other Ambulatory Visit: Payer: Self-pay

## 2023-08-28 DIAGNOSIS — E103553 Type 1 diabetes mellitus with stable proliferative diabetic retinopathy, bilateral: Secondary | ICD-10-CM

## 2023-08-28 MED ORDER — INSULIN LISPRO 100 UNIT/ML IJ SOLN: INTRAMUSCULAR | 4 refills | Status: AC

## 2023-10-10 ENCOUNTER — Ambulatory Visit

## 2023-10-10 ENCOUNTER — Ambulatory Visit (INDEPENDENT_AMBULATORY_CARE_PROVIDER_SITE_OTHER): Admitting: Podiatry

## 2023-10-10 ENCOUNTER — Encounter: Payer: Self-pay | Admitting: Podiatry

## 2023-10-10 VITALS — Ht 72.0 in | Wt 223.8 lb

## 2023-10-10 DIAGNOSIS — M7752 Other enthesopathy of left foot: Secondary | ICD-10-CM | POA: Diagnosis not present

## 2023-10-10 DIAGNOSIS — M7741 Metatarsalgia, right foot: Secondary | ICD-10-CM

## 2023-10-10 DIAGNOSIS — M7751 Other enthesopathy of right foot: Secondary | ICD-10-CM

## 2023-10-10 DIAGNOSIS — B351 Tinea unguium: Secondary | ICD-10-CM | POA: Diagnosis not present

## 2023-10-10 DIAGNOSIS — M7742 Metatarsalgia, left foot: Secondary | ICD-10-CM

## 2023-10-10 MED ORDER — TERBINAFINE HCL 250 MG PO TABS: 250.0000 mg | ORAL_TABLET | Freq: Every day | ORAL | 0 refills | Status: AC

## 2023-10-10 MED ORDER — CLOTRIMAZOLE-BETAMETHASONE 1-0.05 % EX CREA: 1.0000 | TOPICAL_CREAM | Freq: Every day | CUTANEOUS | 2 refills | Status: AC

## 2023-10-10 NOTE — Progress Notes (Signed)
 Chief Complaint  Patient presents with   Foot Pain    Pt is here due to bilateral foot pain states he wears combat boots while working and that his feet hurts often, also complains about red spots on both feet and wants to try another method for toenail fungus states the topical cream that he was using takes a long time.    HPI: 58 y.o. male presenting today as a reestablish new patient for evaluation of multiple complaints of bilateral feet.  First the patient states that over the past month he has developed some red lesions to the right foot.  They do not itch and he has not changed anything in his regular shoe or foot routine.  Also has a long history of fungal nail infection.  He has tried different topicals with no improvement.  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  He has chronic generalized foot pain and achiness.  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   Qualifier: Diagnosis of  By: Jodene LPN, Jaime     Hyperlipidemia    Hypertension    IDDM (insulin  dependent diabetes mellitus) 1983   AGE 89   Retinopathy, DIABETIC    Stroke (HCC) 04/20/2011   left facial weakness, and left hemiparesis    Past Surgical History:  Procedure Laterality Date   CATARACT EXTRACTION  2024   COLONOSCOPY N/A 08/30/2016   Procedure: COLONOSCOPY;  Surgeon: Harvey Margo CROME, MD;  [facility];   Service: Endoscopy;  Laterality: N/A;  12:00pm   COLONOSCOPY WITH PROPOFOL  N/A 01/11/2022   Procedure: COLONOSCOPY WITH PROPOFOL ;  Surgeon: Cindie Carlin POUR, DO;  [facility];  Service: Endoscopy;  Laterality: N/A;  1:00 pm, pt knows to arrive at 7:30   ESOPHAGOGASTRODUODENOSCOPY N/A 08/30/2016   Procedure: ESOPHAGOGASTRODUODENOSCOPY (EGD);  Surgeon: Harvey Margo CROME, MD;  [facility];  Service: Endoscopy;  Laterality: N/A;   EYE SURGERY  01/03/2010   laser surgery to both eyes in July 18 and 26 , 2012   EYE SURGERY  2024   KNEE ARTHROSCOPY  1991 approx   POLYPECTOMY  01/11/2022   Procedure: POLYPECTOMY;  Surgeon: Cindie Carlin POUR, DO;  [facility];  Service: Endoscopy;;    Allergies  Allergen Reactions   Lyumjev  [Insulin  Lispro] Other (See Comments)    Irritation to the arm causing discomfort and burning       RT foot 10/10/2023  Physical Exam: General: The patient is alert and oriented x3 in no acute distress.  Dermatology: Skin is warm, dry and supple bilateral lower extremities.  Inflammatory lesions noted to the right foot.  No vesicular lesions or drainage.  Please see above noted photos.  There are also hyperkeratotic dystrophic nails noted bilateral 1-5 with discoloration consistent with fungal nail infection  Vascular: Palpable pedal pulses bilaterally.  Capillary refill within normal limits.  No appreciable edema.  No erythema.  Neurological: Grossly intact via light touch  Musculoskeletal Exam: No pedal deformities noted.  Generalized diffuse tenderness throughout palpation of the bilateral feet  Radiographic Exam B/L feet 10/10/2023:  Normal osseous mineralization. Joint spaces preserved.  No fractures or osseous irregularities noted.  Assessment/Plan of Care: 1.  Dermatitis skin lesions right foot 2.  Fungal nail infection bilateral 3.  Metatarsalgia with generalized foot pain bilateral  -Patient evaluated.  X-rays  reviewed -Prescription for Lotrisone cream apply twice daily to the dermatitis skin lesions -Prescription for Lamisil  250 mg #90 daily.  Denies a history of liver pathology or symptoms.  Other antifungal modalities have been unsuccessful in improving the appearance of his toenails -I do believe that custom orthotics would help support the medial longitudinal arch of the foot and alleviate a lot of the patient's pain throughout his work shift in his work boots.  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 Thresa EMERSON Sar, DPM    [address] [place], [place] [address]                Office [phone]  Fax [phone]

## 2023-11-17 ENCOUNTER — Other Ambulatory Visit

## 2023-11-20 ENCOUNTER — Ambulatory Visit: Admitting: Endocrinology

## 2024-01-13 ENCOUNTER — Other Ambulatory Visit: Payer: Self-pay | Admitting: Endocrinology

## 2024-01-13 DIAGNOSIS — E103553 Type 1 diabetes mellitus with stable proliferative diabetic retinopathy, bilateral: Secondary | ICD-10-CM

## 2024-04-09 ENCOUNTER — Ambulatory Visit: Admitting: Podiatry
